# Patient Record
Sex: Female | Born: 1955
Health system: Southern US, Community
[De-identification: ages and names within clinical notes are randomized; demographics above are authoritative.]

## PROBLEM LIST (undated history)

## (undated) ENCOUNTER — Ambulatory Visit: Admission: EM | Payer: Medicare HMO

## (undated) DIAGNOSIS — T7840XA Allergy, unspecified, initial encounter: Secondary | ICD-10-CM

## (undated) DIAGNOSIS — M1990 Unspecified osteoarthritis, unspecified site: Secondary | ICD-10-CM

## (undated) DIAGNOSIS — K219 Gastro-esophageal reflux disease without esophagitis: Secondary | ICD-10-CM

## (undated) DIAGNOSIS — R059 Cough, unspecified: Secondary | ICD-10-CM

## (undated) DIAGNOSIS — Z8701 Personal history of pneumonia (recurrent): Secondary | ICD-10-CM

## (undated) DIAGNOSIS — J189 Pneumonia, unspecified organism: Secondary | ICD-10-CM

## (undated) DIAGNOSIS — R7989 Other specified abnormal findings of blood chemistry: Secondary | ICD-10-CM

## (undated) HISTORY — DX: Gastro-esophageal reflux disease without esophagitis: K21.9

## (undated) HISTORY — PX: TUBAL LIGATION: SHX77

## (undated) HISTORY — DX: Unspecified osteoarthritis, unspecified site: M19.90

## (undated) HISTORY — PX: ENDOMETRIAL ABLATION: SHX621

## (undated) HISTORY — PX: VARICOSE VEIN SURGERY: SHX832

## (undated) HISTORY — PX: OTHER SURGICAL HISTORY: SHX169

## (undated) HISTORY — DX: Allergy, unspecified, initial encounter: T78.40XA

---

## 1975-03-30 HISTORY — PX: TONSILLECTOMY: SUR1361

## 1984-03-29 HISTORY — PX: THYROID LOBECTOMY: SHX420

## 2010-06-02 LAB — COLOGUARD: COLOGUARD: NEGATIVE

## 2010-06-02 LAB — HM COLONOSCOPY

## 2010-06-02 NOTE — Progress Notes (Signed)
Subjective:   Anna Miles is a 55 y.o. 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  She denies a history of shortness of breath and wheezing.  Terrible URI/OM in December with spontaneous perforation L TM; ENT followup with resolution but still with intermittent congestion    Evaluation to date: none.   Treatment to date: decongestants, OTC products.  Patient does not smoke cigarettes.  Relevant PMH: No pertinent additional PMH and Asthma.    No current outpatient prescriptions on file.     No Known Allergies     Review of Systems  Pertinent items are noted in HPI.    Objective:     BP 104/62   Temp(Src) 98.3 ??F (36.8 ??C) (Oral)   Ht 5\' 6"  (1.676 m)   Wt 113 lb (51.256 kg)   BMI 18.24 kg/m2  General:  alert, cooperative, no distress   Eyes: conjunctivae/corneas clear.    Ears: abnormal TM AD - erythematous inferior portion, abnormal TM AS - retracted with no perforation noted   Sinuses: Normal paranasal sinuses without tenderness   Mouth:  Lips, mucosa, and tongue normal. Teeth and gums normal and abnormal findings: mild oropharyngeal erythema streaks    Neck: supple, symmetrical, trachea midline and no adenopathy.   Lungs: clear to auscultation bilaterally   Nares   normal         Assessment/Plan:   viral upper respiratory illness  Suggested symptomatic OTC remedies.  RTC prn.  Discussed diagnosis and treatment of viral URIs.  1. Acute nasopharyngitis    .She will return for a physical at her convenience

## 2010-06-02 NOTE — Progress Notes (Signed)
Pt presents with cold like symptoms that she has had for a week; non-productive cough that makes her "gag". Pt states that the cough is "rattlely". Pt noted a slight fever on Saturday.

## 2010-06-03 MED ORDER — TOBRAMYCIN 0.3 % EYE DROPS
0.3 % | OPHTHALMIC | Status: AC
Start: 2010-06-03 — End: 2010-06-08

## 2010-06-03 NOTE — Telephone Encounter (Signed)
Pt notified -

## 2010-06-03 NOTE — Telephone Encounter (Signed)
Pt said she was seen by you on [id number] and this morning she got up and her eyes  Was shut can you call in some thing for her the last time this happen she used tobramycin 0.3% to [facility] at [phone] any question call pt at [phone]

## 2010-06-03 NOTE — Telephone Encounter (Signed)
Probably is viral but the drops will still help with inflammation.  Will send a scrip to the pharmacy we have onfile

## 2010-12-11 NOTE — Progress Notes (Signed)
HISTORY OF PRESENT ILLNESS  Anna Miles is a 55 y.o. female.  She is here today for a physical.  HPI  History of recurrent UTI's since menopause.  Better about water, voiding practices and none now for a year or so.    Feels well and no complaints.  She is considering a skin check though she has not noted any particular lesions of the skin.  She did note a small bump right forehead near her hairline that was larger and shrunk down to current size.  It is non-tender.    Current Outpatient Prescriptions   Medication Sig Dispense Refill   ??? Cholecalciferol, Vitamin D3, (VITAMIN D3) 1,000 unit Cap Take  by mouth.           No Known Allergies  Past Medical History   Diagnosis Date   ??? Herniated disc 2009     lumbar; right radiculopathy; epidural helped     Past Surgical History   Procedure Date   ??? Hx tonsil and adenoidectomy    ??? Hx tubal ligation      Family History   Problem Relation Age of Onset   ??? Heart Disease Mother    ??? Stroke Mother    ??? Elevated Lipids Mother    ??? Hypertension Mother    ??? Alcohol abuse Father    ??? Other Father      complications of hernia surgery   ??? Elevated Lipids Brother    ??? Heart Disease Brother      valvular      History   Substance Use Topics   ??? Smoking status: Never Smoker    ??? Smokeless tobacco: Never Used   ??? Alcohol Use: Yes      0-4/week           Review of Systems   HENT:        Dental care UTD   Eyes: Negative for blurred vision.        Eye care UTD   Respiratory: Negative for cough, shortness of breath and wheezing.    Cardiovascular: Negative for chest pain, palpitations, claudication and leg swelling.   Gastrointestinal: Negative for heartburn, abdominal pain, diarrhea and constipation.   Genitourinary: Negative for frequency.        No incontinence   Musculoskeletal: Negative for joint pain.   Endo/Heme/Allergies: Environmental allergies: minor in the fall only.   Psychiatric/Behavioral: The patient does not have insomnia.        Physical Exam    Constitutional: She appears well-developed and well-nourished. No distress.   HENT:        TM's normal; nasal membranes with no boggy changes and oropharynx without lymphoid hyperplasia   Eyes: Conjunctivae and EOM are normal. Pupils are equal, round, and reactive to light.   Neck: Normal range of motion. Neck supple. No JVD present. No thyromegaly present.   Cardiovascular: Normal rate, regular rhythm, normal heart sounds and intact distal pulses.  Exam reveals no gallop.    No murmur heard.       No carotid or abdominal bruits   Pulmonary/Chest: Effort normal and breath sounds normal. She has no wheezes. She has no rales.   Abdominal: Soft. Bowel sounds are normal. She exhibits no mass. There is no tenderness.   Musculoskeletal: Normal range of motion. She exhibits no edema and no tenderness.   Lymphadenopathy:     She has no cervical adenopathy.   Neurological: She is alert.   Skin: No rash  noted.   Psychiatric: She has a normal mood and affect.     BP 110/56   Ht 5\' 6"  (1.676 m)   Wt 118 lb (53.524 kg)   BMI 19.05 kg/m2    ASSESSMENT and PLAN  1. Well woman exam  All screening, testing and immunizations discussed and up to date.  VITAMIN D, 25 HYDROXY, METABOLIC PANEL, COMPREHENSIVE   2. Need for Tdap vaccination  TETANUS, DIPHTHERIA TOXOIDS AND ACELLULAR PERTUSSIS VACCINE (TDAP), IN INDIVIDS. >=7, IM

## 2010-12-12 LAB — METABOLIC PANEL, COMPREHENSIVE
A-G Ratio: 2.1 (ref 1.1–2.5)
ALT (SGPT): 14 IU/L (ref 0–40)
AST (SGOT): 16 IU/L (ref 0–40)
Albumin: 4.7 g/dL (ref 3.5–5.5)
Alk. phosphatase: 69 IU/L (ref 25–150)
BUN/Creatinine ratio: 26 — ABNORMAL HIGH (ref 9–23)
BUN: 19 mg/dL (ref 6–24)
Bilirubin, total: 0.4 mg/dL (ref 0.0–1.2)
CO2: 30 mmol/L (ref 20–32)
Calcium: 9.7 mg/dL (ref 8.7–10.2)
Chloride: 101 mmol/L (ref 97–108)
Creatinine: 0.72 mg/dL (ref 0.57–1.00)
GFR est non-AA: 94 mL/min/{1.73_m2} (ref >59)
GLOBULIN, TOTAL: 2.2 g/dL (ref 1.5–4.5)
Glucose: 79 mg/dL (ref 65–99)
Potassium: 4.5 mmol/L (ref 3.5–5.2)
Protein, total: 6.9 g/dL (ref 6.0–8.5)
Sodium: 140 mmol/L (ref 134–144)
eGFR If African American: 108 mL/min/{1.73_m2} (ref >59)

## 2010-12-12 LAB — VITAMIN D, 25 HYDROXY: VITAMIN D, 25-HYDROXY: 28.3 ng/mL — ABNORMAL LOW (ref 30.0–100.0)

## 2010-12-12 NOTE — Progress Notes (Signed)
Quick Note:    Looks great!  ______

## 2010-12-13 NOTE — Progress Notes (Signed)
Quick Note:    Chemistries are normal but the vitamin D is a bit low. A 400 unit supplement daily will bring it into the normal range.  ______

## 2012-09-25 NOTE — Patient Instructions (Addendum)
PRESCRIPTION REFILL POLICY  Effective 06/28/2012    In an effort to ensure that the large volume of prescription refills are processed in the most efficient and expeditious manner, we are asking our patients to assist [place] by calling your pharmacy for all prescription refills. This will include Mail Order Pharmacies. The pharmacy will contact our office electronically to continue the refill process.     Please do not wait until the last minute to call your pharmacy. We require 72 hours (3 days) to fill prescriptions. We also encourage you to call your pharmacy before picking up your prescription to make sure it is ready.    With regard to controlled substance refill request (narcotics and many ADD/ADHD treatment prescriptions) and any other prescriptions that need to be picked up at our office, we ask your cooperation by providing [place] with at least 72 hours (3 days) notice prior to your need of the prescription. You will need to show a valid ID when picking up your prescription. Anyone delegated by you to pick up your prescriptions must be listed be listed on you HIPPA authorization form, and show a valid ID.    Narcotics will not be refilled on a weekend or on a holiday in which the office is closed.    We also encourage an alternative method of refill requests, which is through our medically secure web portal, MyChart. This is an efficient and effective way to communicate your requests directly with the office and provider. MyChart can be downloaded onto your smartphone as an App. If you are ready to be connected, please review the attached instructions or speak to any of our staff to get you set up right away!    Thank you so much for your cooperation. Should you have any questions, please contact our Practice Administrator, Lysle Dingwall at [phone]    Varicose Veins: After Your Visit  Your Care Instructions  Varicose veins are twisted, enlarged veins near the surface of the skin. They develop most often in the  legs and ankles.  Some people may be more likely than others to get varicose veins because of aging or hormone changes or because a parent has them. Being overweight or pregnant can make varicose veins worse. Jobs that require standing for long periods of time also can make them worse.  Follow-up care is a key part of your treatment and safety. Be sure to make and go to all appointments, and call your doctor if you are having problems. It???s also a good idea to know your test results and keep a list of the medicines you take.  How can you care for yourself at home?  ?? Wear compression stockings during the day to help relieve symptoms and slow how quickly the varicose veins get worse. They improve blood flow and are the main treatment for varicose veins. Talk to your doctor about which ones to get and where to get them.  ?? Prop up your legs at or above the level of your heart when possible. This helps keep the blood from pooling in your lower legs and improves blood flow to the rest of your body.  ?? Avoid sitting and standing for long periods. This puts added stress on your veins.  ?? Get regular exercise, and control your weight. Walk, bicycle, or swim to improve blood flow in your legs.  ?? If you bump your leg so hard that you know it is likely to bruise, prop up your leg and put ice  or a cold pack on the area for 10 to 20 minutes at a time. Try to do this every 1 to 2 hours for the next 3 days (when you are awake) or until the swelling goes down. Put a thin cloth between the ice and your skin.  ?? If you cut or scratch the skin over a vein, it may bleed a lot. Prop up your leg and apply firm pressure with a clean bandage over the site of the bleeding. Continue to apply pressure for a full 15 minutes. Do not check sooner to see if the bleeding has stopped. If the bleeding has not stopped after 15 minutes, apply pressure again for another 15 minutes. You can repeat this up to 3 times for a total of 45 minutes.  If you  have a blood clot in a varicose vein, you may have tenderness and swelling over the vein. The vein may feel firm. Be sure to call your doctor right away if you have these symptoms. If your doctor has told you how to care for the clot, follow his or her instructions. Care may include the following:  ?? Prop up your leg and apply heat with a warm, damp cloth or a heating pad set on low (put a towel or cloth between your leg and the heating pad to prevent burns).  ?? Take anti-inflammatory medicines to reduce pain and swelling. These include ibuprofen (Advil, Motrin) and naproxen (Aleve). Read and follow all instructions on the label.  When should you call for help?  Call 911 anytime you think you may need emergency care. For example, call if:  ?? You have sudden chest pain and shortness of breath, or you cough up blood.  Call your doctor now or seek immediate medical care if:  ?? You have signs of a blood clot, such as:  ?? Pain in your calf, back of the knee, thigh, or groin.  ?? Redness and swelling in your leg or groin.  ?? A varicose vein begins to bleed and you cannot stop it.  ?? You have a tender lump in your leg.  ?? You get an open sore.  Watch closely for changes in your health, and be sure to contact your doctor if:  ?? Your varicose vein symptoms do not improve with home treatment.   Where can you learn more?   Go to [email or web address]  Enter B637 in the search box to learn more about "Varicose Veins: After Your Visit."   ?? 2006-2014 [employer]. Care instructions adapted under license by [employer] (which disclaims liability or warranty for this information). This care instruction is for use with your licensed healthcare professional. If you have questions about a medical condition or this instruction, always ask your healthcare professional. [employer], Inc[employer] disclaims any warranty or liability for your use of this information.  Content Version: 10.0.270728; Last Revised:  November 02, 2011

## 2012-09-25 NOTE — Progress Notes (Signed)
Outpatient   Followup Note        Assessment :    Plan:      57  Year old with transient dependent edema with varicosities and puriplish ruptured veins last week now having resolved.   Posterior varicose veins, trace nonpitting edema right ankle greater than left  Vitamin D defficiency  Given her family hx of varicosities and AVR, likely CAD - counseling on salt intake , DASH diet  - varicose veins , not crossing her legs, knee high compression stockings  - given her family hx , 2nd hand smoke exposure, we will order ABI to be done with exercise  -she declined LE dopplers at this time due to no pain and no palpable cord.  - cholesterol per Dr. Adin Hector to be faxed to [place]                   ICD-9-CM    1. Edema 782.3 ANKLE BRACHIAL INDEX   2. Varicosities of leg 454.9 ANKLE BRACHIAL INDEX   3. Unspecified vitamin D deficiency 268.9    .    Chief Complaint:  Bilateral ankle swelling  For 1 week    S: 57 year old  With ankle bilateral edema  1 week ago with some    She is active   5 to 6 am every day  With intensive  Exercise cycles 3 times a week, body pump, and  Yoga.     Review of Systems:    Symptom Y/N Comments  Symptom Y/N Comments   Fever/Chills n   Chest Pain n    Poor Appetite n   Edema n    Cough n   Abdominal Pain n    Sputum n   Joint Pain n    SOB/DOE n   Pruritis/Rash n    Nausea/vomit n   Tolerating PT/OT     Diarrhea n   Tolerating Diet y    Constipation n   Other         Objective:     VITALS: BP 130/74   Pulse 80   Temp(Src) 99.1 ??F (37.3 ??C) (Oral)   Resp 14   Ht 5' 5.25" (1.657 m)   Wt 109 lb (49.442 kg)   BMI 18.01 kg/m2   SpO2 96%      PHYSICAL EXAM:  General: Alert, cooperative, no acute distress    EENT:  EOMI. Anicteric sclerae. PERRL; no goiter; TM's cloudy;  Resp:  CTA Bilaterally. No Wheezing/Rhonchi/Rales. No access. muscle use  CV:  Regular  rhythm,  No murmur (), No Rubs, No Gallops. No edema  GI:  Soft, Non  distended, Non tender.  +Bowel sounds, no HSM  Neurologic:  CN 2-12 gi, Alert and oriented X 3.    Psych:   Good insight. Not anxious nor agitated.  UE:             No upper extremity edema;   LE:             Right greater than left ankle nonpitting ankle edema; with varicositiesk no palpable cords.   Skin:  Good Turgor, no rashes or ulcers      Current Outpatient Prescriptions   Medication Sig Dispense Refill   ??? Cholecalciferol, Vitamin D3, (VITAMIN D3) 1,000 unit Cap Take  by mouth.              Greater than 50% of this visit was spent in counseling the patient about varicosities  and .

## 2012-09-25 NOTE — Progress Notes (Signed)
Reviewed record in preparation for visit and have obtained necessary documentation    Health Maintenance Due   Topic   ??? Pap Aka Cervical Cytology    ??? Breast Cancer Scrn Mammogram    ??? Fobt Q 1 Year Age 58-75          Chief Complaint   Patient presents with   ??? Ankle swelling     pt states that she has had ankle and leg swelling since 09/17/12 she states that she started propping up her legs on 09/24/12 and swelling started going down.  she stated that while legs and ankle was swollen purple spots was present throughout leg and ankle        Wt Readings from Last 3 Encounters:   09/25/12 109 lb (49.442 kg)   12/11/10 118 lb (53.524 kg)   06/02/10 113 lb (51.256 kg)     Temp Readings from Last 3 Encounters:   09/25/12 99.1 ??F (37.3 ??C) Oral   06/02/10 98.3 ??F (36.8 ??C) Oral     BP Readings from Last 3 Encounters:   09/25/12 130/74   12/11/10 110/56   06/02/10 104/62     Pulse Readings from Last 3 Encounters:   09/25/12 80         Learning Assessment:  :     Learning Assessment 09/25/2012   PRIMARY LEARNER Patient   HIGHEST LEVEL OF EDUCATION - PRIMARY LEARNER  4 YEARS OF COLLEGE   BARRIERS PRIMARY LEARNER NONE   CO-LEARNER CAREGIVER No   PRIMARY LANGUAGE ENGLISH   INTERPRETER NEED No   LEARNER PREFERENCE PRIMARY OTHER (COMMENT)   LEARNING SPECIAL TOPICS no   ANSWERED BY patient   RELATIONSHIP SELF   ASSESSMENT COMMENT none       Depression Screening:  :     PHQ 2 / 9, over the last two weeks 09/25/2012   Little interest or pleasure in doing things Not at all   Feeling down, depressed or hopeless Not at all   Total Score PHQ 2 0       Fall Risk Assessment:  :         Abuse Screening:  :     Abuse Screening Questionnaire 09/25/2012   Do you ever feel afraid of your partner? N   Are you in a relationship with someone who physically or mentally threatens you? N   Is it safe for you to go home? Y       Coordination of Care Questionnaire:  :     1) Have you been to an emergency room, urgent care clinic since your last visit?  no   Hospitalized since your last visit? no             2) Have you seen or consulted any other health care providers outside of [facility] since your last visit? no  (Include any pap smears or colon screenings in this section.)    3) Do you have an Advance Directive on file? no  Are you interested in receiving information about Advance Directives? no    Patient is accompanied by self I have received verbal consent from Paulla Fore to discuss any/all medical information while they are present in the room.

## 2012-09-28 NOTE — Progress Notes (Signed)
Quick Note:    My charts email sent on 09/28/2012..  ______

## 2012-09-28 NOTE — Procedures (Signed)
[facility]  *** FINAL REPORT ***    Name: Anna Miles, Anna Miles  MRN: [id number]  DOB: 29 Oct 1955  HIS Order #: [id number]  TRAKnet Visit #: [id number]  Date: 27 Sep 2012    TYPE OF TEST: Peripheral Arterial Testing    REASON FOR TEST  PAD suspected    Right Leg  Doppler:  PVR:  Ankle/Brachial: 1.19    Left Leg  Doppler:  PVR:  Ankle/Brachial: 1.27    INTERPRETATION/FINDINGS  PROCEDURE:  Evaluation of lower extremity arteries with systolic blood   pressure measurement at the ankle and brachial level and calculation  of the ankle/brachial pressure index (ABI).  The exam may also include   pulse volume recording (PVR) plethysmography at the ankle level.    FINDINGS:  ABI is 1.19 on the right and 1.27 on the left.  PVR  waveforms are normal at the right ankle and normal at the left ankle.    IMPRESSION:  There is no evidence of hemodynamically significant lower   extremity arterial obstruction.    ADDITIONAL COMMENTS    I have personally reviewed the data relevant to the interpretation of  this  study.    TECHNOLOGIST: James Kaczor, RVT  Signed: 09/27/2012 03:25 PM    PHYSICIAN: Aliyyah Riese Stoneburner, Jr., MD  Signed: 09/28/2012 11:22 AM

## 2012-09-28 NOTE — Procedures (Signed)
[facility]  *** FINAL REPORT ***    Name: Anna Miles, Anna Miles  MRN: [id number]  DOB: 07/06/1955  HIS Order #: [id number]  TRAKnet Visit #: [id number]  Date: 27 Sep 2012    TYPE OF TEST: Peripheral Arterial Testing    REASON FOR TEST  PAD suspected    Right Leg  Doppler:  PVR:  Ankle/Brachial: 1.19    Left Leg  Doppler:  PVR:  Ankle/Brachial: 1.27    INTERPRETATION/FINDINGS  PROCEDURE:  Evaluation of lower extremity arteries with systolic blood   pressure measurement at the ankle and brachial level and calculation  of the ankle/brachial pressure index (ABI).  The exam may also include   pulse volume recording (PVR) plethysmography at the ankle level.    FINDINGS:  ABI is 1.19 on the right and 1.27 on the left.  PVR  waveforms are normal at the right ankle and normal at the left ankle.    IMPRESSION:  There is no evidence of hemodynamically significant lower   extremity arterial obstruction.    ADDITIONAL COMMENTS    I have personally reviewed the data relevant to the interpretation of  this  study.    TECHNOLOGIST: Bonnye Fava, RVT  Signed: 09/27/2012 03:25 PM    PHYSICIAN: Roylene Reason., MD  Signed: 09/28/2012 11:22 AM

## 2014-03-07 LAB — HM MAMMOGRAPHY

## 2015-05-09 ENCOUNTER — Encounter: Payer: Self-pay | Admitting: Internal Medicine

## 2015-05-09 ENCOUNTER — Ambulatory Visit (INDEPENDENT_AMBULATORY_CARE_PROVIDER_SITE_OTHER): Payer: BLUE CROSS/BLUE SHIELD | Admitting: Internal Medicine

## 2015-05-09 VITALS — BP 108/68 | HR 74 | Temp 98.6°F | Ht 65.33 in | Wt 152.0 lb

## 2015-05-09 DIAGNOSIS — K219 Gastro-esophageal reflux disease without esophagitis: Secondary | ICD-10-CM

## 2015-05-09 DIAGNOSIS — J309 Allergic rhinitis, unspecified: Secondary | ICD-10-CM | POA: Diagnosis not present

## 2015-05-09 DIAGNOSIS — K7689 Other specified diseases of liver: Secondary | ICD-10-CM

## 2015-05-09 NOTE — Patient Instructions (Signed)
Upper Respiratory Infection, Adult Most upper respiratory infections (URIs) are a viral infection of the air passages leading to the lungs. A URI affects the nose, throat, and upper air passages. The most common type of URI is nasopharyngitis and is typically referred to as "the common cold." URIs run their course and usually go away on their own. Most of the time, a URI does not require medical attention, but sometimes a bacterial infection in the upper airways can follow a viral infection. This is called a secondary infection. Sinus and middle ear infections are common types of secondary upper respiratory infections. Bacterial pneumonia can also complicate a URI. A URI can worsen asthma and chronic obstructive pulmonary disease (COPD). Sometimes, these complications can require emergency medical care and may be life threatening.  CAUSES Almost all URIs are caused by viruses. A virus is a type of germ and can spread from one person to another.  RISKS FACTORS You may be at risk for a URI if:   You smoke.   You have chronic heart or lung disease.  You have a weakened defense (immune) system.   You are very young or very old.   You have nasal allergies or asthma.  You work in crowded or poorly ventilated areas.  You work in health care facilities or schools. SIGNS AND SYMPTOMS  Symptoms typically develop 2-3 days after you come in contact with a cold virus. Most viral URIs last 7-10 days. However, viral URIs from the influenza virus (flu virus) can last 14-18 days and are typically more severe. Symptoms may include:   Runny or stuffy (congested) nose.   Sneezing.   Cough.   Sore throat.   Headache.   Fatigue.   Fever.   Loss of appetite.   Pain in your forehead, behind your eyes, and over your cheekbones (sinus pain).  Muscle aches.  DIAGNOSIS  Your health care provider may diagnose a URI by:  Physical exam.  Tests to check that your symptoms are not due to  another condition such as:  Strep throat.  Sinusitis.  Pneumonia.  Asthma. TREATMENT  A URI goes away on its own with time. It cannot be cured with medicines, but medicines may be prescribed or recommended to relieve symptoms. Medicines may help:  Reduce your fever.  Reduce your cough.  Relieve nasal congestion. HOME CARE INSTRUCTIONS   Take medicines only as directed by your health care provider.   Gargle warm saltwater or take cough drops to comfort your throat as directed by your health care provider.  Use a warm mist humidifier or inhale steam from a shower to increase air moisture. This may make it easier to breathe.  Drink enough fluid to keep your urine clear or pale yellow.   Eat soups and other clear broths and maintain good nutrition.   Rest as needed.   Return to work when your temperature has returned to normal or as your health care provider advises. You may need to stay home longer to avoid infecting others. You can also use a face mask and careful hand washing to prevent spread of the virus.  Increase the usage of your inhaler if you have asthma.   Do not use any tobacco products, including cigarettes, chewing tobacco, or electronic cigarettes. If you need help quitting, ask your health care provider. PREVENTION  The best way to protect yourself from getting a cold is to practice good hygiene.   Avoid oral or hand contact with people with cold   symptoms.   Wash your hands often if contact occurs.  There is no clear evidence that vitamin C, vitamin E, echinacea, or exercise reduces the chance of developing a cold. However, it is always recommended to get plenty of rest, exercise, and practice good nutrition.  SEEK MEDICAL CARE IF:   You are getting worse rather than better.   Your symptoms are not controlled by medicine.   You have chills.  You have worsening shortness of breath.  You have brown or red mucus.  You have yellow or brown nasal  discharge.  You have pain in your face, especially when you bend forward.  You have a fever.  You have swollen neck glands.  You have pain while swallowing.  You have white areas in the back of your throat. SEEK IMMEDIATE MEDICAL CARE IF:   You have severe or persistent:  Headache.  Ear pain.  Sinus pain.  Chest pain.  You have chronic lung disease and any of the following:  Wheezing.  Prolonged cough.  Coughing up blood.  A change in your usual mucus.  You have a stiff neck.  You have changes in your:  Vision.  Hearing.  Thinking.  Mood. MAKE SURE YOU:   Understand these instructions.  Will watch your condition.  Will get help right away if you are not doing well or get worse.   This information is not intended to replace advice given to you by your health care provider. Make sure you discuss any questions you have with your health care provider.   Document Released: 09/08/2000 Document Revised: 07/30/2014 Document Reviewed: 06/20/2013 Elsevier Interactive Patient Education 2016 [employer]  

## 2015-05-09 NOTE — Progress Notes (Signed)
Pre visit review using our clinic review tool, if applicable. No additional management support is needed unless otherwise documented below in the visit note. 

## 2015-05-09 NOTE — Progress Notes (Signed)
HPI  Pt presents to the clinic today to establish care and for management of the conditions listed below. She moved here from [place] and is transferring care from Dr. Loletta Specter.  GERD: Rare. Triggered by spicy foods. She does try to avoid these. She does not take anything OTC.   She also c/o nasal congestion and cough. She reports this started 8 days ago. She is blowing clear mucous out of her nose. She is coughing clear mucous up. She has run low grade fevers but denies fever, chills or body aches.. She has taken Ibuprofen with some relief. She has not had sick contacts. She has no history of seasonal allergies.  She also reports she has a liver cyst. She has been getting yearly ultrasounds and report she is due for this now.  Flu: 2015 Tetanus: 2011 Zostovax: never Pap Smear: 04/2014 Mammogram: 05/2014 Colon Screening: 2012, every 5 years Vision Screening: yearly Dentist: yearly  Past Medical History  Diagnosis Date  . GERD (gastroesophageal reflux disease)     Current Outpatient Prescriptions  Medication Sig Dispense Refill  . OVER THE COUNTER MEDICATION Take 1 capsule by mouth 3 (three) times daily. Ultra Vir-X---dietary supplement     No current facility-administered medications for this visit.    Allergies  Allergen Reactions  . Penicillins Rash    Family History  Problem Relation Age of Onset  . Colon cancer Mother   . Heart failure Mother   . Lung cancer Paternal Grandfather     Social History   Social History  . Marital Status: Married    Spouse Name: N/A  . Number of Children: N/A  . Years of Education: N/A   Occupational History  . Not on file.   Social History Main Topics  . Smoking status: Never Smoker   . Smokeless tobacco: Never Used  . Alcohol Use: 0.0 oz/week    0 Standard drinks or equivalent per week     Comment: occasional wine  . Drug Use: Not on file  . Sexual Activity: Not on file   Other Topics Concern  . Not on file    Social History Narrative  . No narrative on file    ROS:  Constitutional: Denies fever, malaise, fatigue, headache or abrupt weight changes.  HEENT: Pt reports nasal congestion. Denies eye pain, eye redness, ear pain, ringing in the ears, wax buildup, runny nose, bloody nose, or sore throat. Respiratory: Pt reports cough. Denies difficulty breathing, shortness of breath, or sputum production.   Cardiovascular: Denies chest pain, chest tightness, palpitations or swelling in the hands or feet.  Gastrointestinal: Denies abdominal pain, bloating, constipation, diarrhea or blood in the stool.  Neurological: Denies dizziness, difficulty with memory, difficulty with speech or problems with balance and coordination.  Psych: Denies anxiety, depression, SI/HI.  No other specific complaints in a complete review of systems (except as listed in HPI above).  PE:  BP 108/68 mmHg  Pulse 74  Temp(Src) 98.6 F (37 C) (Oral)  Ht 5' 5.33" (1.659 m)  Wt 152 lb (68.947 kg)  BMI 25.05 kg/m2  SpO2 99% Wt Readings from Last 3 Encounters:  05/09/15 152 lb (68.947 kg)    General: Appears her stated age, well developed, well nourished in NAD. HEENT: Head: normal shape and size, no sinus tenderness noted; Eyes: sclera white, no icterus, conjunctiva pink; Ears: Tm's gray and intact, normal light reflex; Throat/Mouth: Teeth present, mucosa pink and moist, + PND, no lesions or ulcerations noted.  Neck:  No adenopathy noted.  Cardiovascular: Normal rate and rhythm. S1,S2 noted.  No murmur, rubs or gallops noted. Pulmonary/Chest: Normal effort and positive vesicular breath sounds. No respiratory distress. No wheezes, rales or ronchi noted.  Abdomen: Soft and nontender. Normal bowel sounds. Neurological: Alert and oriented.   Psychiatric: Mood and affect normal. Behavior is normal. Judgment and thought content normal.     Assessment and Plan:  Allergic Rhinitis:  Advised her to start Zyrtec and Flonase  OTC  Liver Cyst:  RUQ ultrasound ordered  GERD:  Avoid triggers Ok to take TUMS or Rolaids as needed  Make an appt for your annual exam

## 2015-05-14 ENCOUNTER — Ambulatory Visit
Admission: RE | Admit: 2015-05-14 | Discharge: 2015-05-14 | Disposition: A | Discharge status: Home / Self Care | Payer: BLUE CROSS/BLUE SHIELD | Source: Ambulatory Visit | Attending: Internal Medicine | Admitting: Internal Medicine

## 2015-05-14 ENCOUNTER — Other Ambulatory Visit: Payer: Self-pay | Admitting: Internal Medicine

## 2015-05-14 DIAGNOSIS — N281 Cyst of kidney, acquired: Secondary | ICD-10-CM

## 2015-05-14 DIAGNOSIS — K7689 Other specified diseases of liver: Secondary | ICD-10-CM | POA: Diagnosis present

## 2015-05-14 DIAGNOSIS — K8020 Calculus of gallbladder without cholecystitis without obstruction: Secondary | ICD-10-CM | POA: Diagnosis not present

## 2015-05-14 DIAGNOSIS — Q6100 Congenital renal cyst, unspecified: Secondary | ICD-10-CM | POA: Diagnosis present

## 2015-05-14 NOTE — Addendum Note (Signed)
Addended by: Lurlean Nanny on: 05/14/2015 08:17 AM   Modules accepted: Orders

## 2015-08-18 ENCOUNTER — Encounter: Payer: BLUE CROSS/BLUE SHIELD | Admitting: Internal Medicine

## 2015-11-12 ENCOUNTER — Ambulatory Visit (INDEPENDENT_AMBULATORY_CARE_PROVIDER_SITE_OTHER): Payer: BLUE CROSS/BLUE SHIELD | Admitting: Internal Medicine

## 2015-11-12 ENCOUNTER — Encounter: Payer: Self-pay | Admitting: Internal Medicine

## 2015-11-12 ENCOUNTER — Ambulatory Visit (INDEPENDENT_AMBULATORY_CARE_PROVIDER_SITE_OTHER)
Admission: RE | Admit: 2015-11-12 | Discharge: 2015-11-12 | Disposition: A | Discharge status: Home / Self Care | Payer: BLUE CROSS/BLUE SHIELD | Source: Ambulatory Visit | Attending: Internal Medicine | Admitting: Internal Medicine

## 2015-11-12 VITALS — BP 116/80 | HR 66 | Temp 98.4°F | Ht 65.0 in | Wt 155.0 lb

## 2015-11-12 DIAGNOSIS — Z1211 Encounter for screening for malignant neoplasm of colon: Secondary | ICD-10-CM

## 2015-11-12 DIAGNOSIS — Z9009 Acquired absence of other part of head and neck: Secondary | ICD-10-CM

## 2015-11-12 DIAGNOSIS — E890 Postprocedural hypothyroidism: Secondary | ICD-10-CM | POA: Diagnosis not present

## 2015-11-12 DIAGNOSIS — M25552 Pain in left hip: Secondary | ICD-10-CM

## 2015-11-12 DIAGNOSIS — Z1239 Encounter for other screening for malignant neoplasm of breast: Secondary | ICD-10-CM

## 2015-11-12 DIAGNOSIS — Z0000 Encounter for general adult medical examination without abnormal findings: Secondary | ICD-10-CM | POA: Diagnosis not present

## 2015-11-12 DIAGNOSIS — Z780 Asymptomatic menopausal state: Secondary | ICD-10-CM

## 2015-11-12 NOTE — Patient Instructions (Signed)
Health Maintenance, Female Adopting a healthy lifestyle and getting preventive care can go a long way to promote health and wellness. Talk with your health care provider about what schedule of regular examinations is right for you. This is a good chance for you to check in with your provider about disease prevention and staying healthy. In between checkups, there are plenty of things you can do on your own. Experts have done a lot of research about which lifestyle changes and preventive measures are most likely to keep you healthy. Ask your health care provider for more information. WEIGHT AND DIET  Eat a healthy diet  Be sure to include plenty of vegetables, fruits, low-fat dairy products, and lean protein.  Do not eat a lot of foods high in solid fats, added sugars, or salt.  Get regular exercise. This is one of the most important things you can do for your health.  Most adults should exercise for at least 150 minutes each week. The exercise should increase your heart rate and make you sweat (moderate-intensity exercise).  Most adults should also do strengthening exercises at least twice a week. This is in addition to the moderate-intensity exercise.  Maintain a healthy weight  Body mass index (BMI) is a measurement that can be used to identify possible weight problems. It estimates body fat based on height and weight. Your health care provider can help determine your BMI and help you achieve or maintain a healthy weight.  For females 20 years of age and older:   A BMI below 18.5 is considered underweight.  A BMI of 18.5 to 24.9 is normal.  A BMI of 25 to 29.9 is considered overweight.  A BMI of 30 and above is considered obese.  Watch levels of cholesterol and blood lipids  You should start having your blood tested for lipids and cholesterol at 61 years of age, then have this test every 5 years.  You may need to have your cholesterol levels checked more often if:  Your lipid  or cholesterol levels are high.  You are older than 61 years of age.  You are at high risk for heart disease.  CANCER SCREENING   Lung Cancer  Lung cancer screening is recommended for adults 55-80 years old who are at high risk for lung cancer because of a history of smoking.  A yearly low-dose CT scan of the lungs is recommended for people who:  Currently smoke.  Have quit within the past 15 years.  Have at least a 30-pack-year history of smoking. A pack year is smoking an average of one pack of cigarettes a day for 1 year.  Yearly screening should continue until it has been 15 years since you quit.  Yearly screening should stop if you develop a health problem that would prevent you from having lung cancer treatment.  Breast Cancer  Practice breast self-awareness. This means understanding how your breasts normally appear and feel.  It also means doing regular breast self-exams. Let your health care provider know about any changes, no matter how small.  If you are in your 20s or 30s, you should have a clinical breast exam (CBE) by a health care provider every 1-3 years as part of a regular health exam.  If you are 40 or older, have a CBE every year. Also consider having a breast X-ray (mammogram) every year.  If you have a family history of breast cancer, talk to your health care provider about genetic screening.  If you   are at high risk for breast cancer, talk to your health care provider about having an MRI and a mammogram every year.  Breast cancer gene (BRCA) assessment is recommended for women who have family members with BRCA-related cancers. BRCA-related cancers include:  Breast.  Ovarian.  Tubal.  Peritoneal cancers.  Results of the assessment will determine the need for genetic counseling and BRCA1 and BRCA2 testing. Cervical Cancer Your health care provider may recommend that you be screened regularly for cancer of the pelvic organs (ovaries, uterus, and  vagina). This screening involves a pelvic examination, including checking for microscopic changes to the surface of your cervix (Pap test). You may be encouraged to have this screening done every 3 years, beginning at age 21.  For women ages 30-65, health care providers may recommend pelvic exams and Pap testing every 3 years, or they may recommend the Pap and pelvic exam, combined with testing for human papilloma virus (HPV), every 5 years. Some types of HPV increase your risk of cervical cancer. Testing for HPV may also be done on women of any age with unclear Pap test results.  Other health care providers may not recommend any screening for nonpregnant women who are considered low risk for pelvic cancer and who do not have symptoms. Ask your health care provider if a screening pelvic exam is right for you.  If you have had past treatment for cervical cancer or a condition that could lead to cancer, you need Pap tests and screening for cancer for at least 20 years after your treatment. If Pap tests have been discontinued, your risk factors (such as having a new sexual partner) need to be reassessed to determine if screening should resume. Some women have medical problems that increase the chance of getting cervical cancer. In these cases, your health care provider may recommend more frequent screening and Pap tests. Colorectal Cancer  This type of cancer can be detected and often prevented.  Routine colorectal cancer screening usually begins at 61 years of age and continues through 61 years of age.  Your health care provider may recommend screening at an earlier age if you have risk factors for colon cancer.  Your health care provider may also recommend using home test kits to check for hidden blood in the stool.  A small camera at the end of a tube can be used to examine your colon directly (sigmoidoscopy or colonoscopy). This is done to check for the earliest forms of colorectal  cancer.  Routine screening usually begins at age 50.  Direct examination of the colon should be repeated every 5-10 years through 61 years of age. However, you may need to be screened more often if early forms of precancerous polyps or small growths are found. Skin Cancer  Check your skin from head to toe regularly.  Tell your health care provider about any new moles or changes in moles, especially if there is a change in a mole's shape or color.  Also tell your health care provider if you have a mole that is larger than the size of a pencil eraser.  Always use sunscreen. Apply sunscreen liberally and repeatedly throughout the day.  Protect yourself by wearing long sleeves, pants, a wide-brimmed hat, and sunglasses whenever you are outside. HEART DISEASE, DIABETES, AND HIGH BLOOD PRESSURE   High blood pressure causes heart disease and increases the risk of stroke. High blood pressure is more likely to develop in:  People who have blood pressure in the high end   of the normal range (130-139/85-89 mm Hg).  People who are overweight or obese.  People who are African American.  If you are 38-23 years of age, have your blood pressure checked every 3-5 years. If you are 61 years of age or older, have your blood pressure checked every year. You should have your blood pressure measured twice--once when you are at a hospital or clinic, and once when you are not at a hospital or clinic. Record the average of the two measurements. To check your blood pressure when you are not at a hospital or clinic, you can use:  An automated blood pressure machine at a pharmacy.  A home blood pressure monitor.  If you are between 45 years and 39 years old, ask your health care provider if you should take aspirin to prevent strokes.  Have regular diabetes screenings. This involves taking a blood sample to check your fasting blood sugar level.  If you are at a normal weight and have a low risk for diabetes,  have this test once every three years after 61 years of age.  If you are overweight and have a high risk for diabetes, consider being tested at a younger age or more often. PREVENTING INFECTION  Hepatitis B  If you have a higher risk for hepatitis B, you should be screened for this virus. You are considered at high risk for hepatitis B if:  You were born in a country where hepatitis B is common. Ask your health care provider which countries are considered high risk.  Your parents were born in a high-risk country, and you have not been immunized against hepatitis B (hepatitis B vaccine).  You have HIV or AIDS.  You use needles to inject street drugs.  You live with someone who has hepatitis B.  You have had sex with someone who has hepatitis B.  You get hemodialysis treatment.  You take certain medicines for conditions, including cancer, organ transplantation, and autoimmune conditions. Hepatitis C  Blood testing is recommended for:  Everyone born from [birth year] through 1966.  Anyone with known risk factors for hepatitis C. Sexually transmitted infections (STIs)  You should be screened for sexually transmitted infections (STIs) including gonorrhea and chlamydia if:  You are sexually active and are younger than 61 years of age.  You are older than 61 years of age and your health care provider tells you that you are at risk for this type of infection.  Your sexual activity has changed since you were last screened and you are at an increased risk for chlamydia or gonorrhea. Ask your health care provider if you are at risk.  If you do not have HIV, but are at risk, it may be recommended that you take a prescription medicine daily to prevent HIV infection. This is called pre-exposure prophylaxis (PrEP). You are considered at risk if:  You are sexually active and do not regularly use condoms or know the HIV status of your partner(s).  You take drugs by injection.  You are sexually  active with a partner who has HIV. Talk with your health care provider about whether you are at high risk of being infected with HIV. If you choose to begin PrEP, you should first be tested for HIV. You should then be tested every 3 months for as long as you are taking PrEP.  PREGNANCY   If you are premenopausal and you may become pregnant, ask your health care provider about preconception counseling.  If you may  become pregnant, take 400 to 800 micrograms (mcg) of folic acid every day.  If you want to prevent pregnancy, talk to your health care provider about birth control (contraception). OSTEOPOROSIS AND MENOPAUSE   Osteoporosis is a disease in which the bones lose minerals and strength with aging. This can result in serious bone fractures. Your risk for osteoporosis can be identified using a bone density scan.  If you are 61 years of age or older, or if you are at risk for osteoporosis and fractures, ask your health care provider if you should be screened.  Ask your health care provider whether you should take a calcium or vitamin D supplement to lower your risk for osteoporosis.  Menopause may have certain physical symptoms and risks.  Hormone replacement therapy may reduce some of these symptoms and risks. Talk to your health care provider about whether hormone replacement therapy is right for you.  HOME CARE INSTRUCTIONS   Schedule regular health, dental, and eye exams.  Stay current with your immunizations.   Do not use any tobacco products including cigarettes, chewing tobacco, or electronic cigarettes.  If you are pregnant, do not drink alcohol.  If you are breastfeeding, limit how much and how often you drink alcohol.  Limit alcohol intake to no more than 1 drink per day for nonpregnant women. One drink equals 12 ounces of beer, 5 ounces of wine, or 1 ounces of hard liquor.  Do not use street drugs.  Do not share needles.  Ask your health care provider for help if  you need support or information about quitting drugs.  Tell your health care provider if you often feel depressed.  Tell your health care provider if you have ever been abused or do not feel safe at home.   This information is not intended to replace advice given to you by your health care provider. Make sure you discuss any questions you have with your health care provider.   Document Released: 09/28/2010 Document Revised: 04/05/2014 Document Reviewed: 02/14/2013 Elsevier Interactive Patient Education [employer].

## 2015-11-12 NOTE — Progress Notes (Signed)
HPI  Pt presents to the clinic today for her annual exam.  Flu: [id number] Tetanus: 2011 Zostovax: never Pap Smear: 04/2014 Mammogram: 05/2014 Bone Density: > 5 years ago Colon Screening: 2012, every 5 years Vision Screening: yearly Dentist: yearly  She has been having left hip pain. This started 3 months ago. She denies any injury to the area at that time. She describes the pain as aching. The pain radiates around to her left groin. The pain is  worse with laying down at night. She did see a chiropractor last week with some relief. She denies back pain. She has taken Motrin but is not sure if it helped.  Diet: She does eat meat. She consumes some fruits and veggies. She tries to avoid fried foods. She drinks mostly water. Exercise: She is walking 4 days per week.   Past Medical History:  Diagnosis Date  . GERD (gastroesophageal reflux disease)     No current outpatient prescriptions on file.   No current facility-administered medications for this visit.     Allergies  Allergen Reactions  . Penicillins Rash    Family History  Problem Relation Age of Onset  . Colon cancer Mother   . Heart failure Mother   . Lung cancer Paternal Grandfather   . Cancer Sister     Glioblastoma    Social History   Social History  . Marital status: Married    Spouse name: N/A  . Number of children: N/A  . Years of education: N/A   Occupational History  . Not on file.   Social History Main Topics  . Smoking status: Never Smoker  . Smokeless tobacco: Never Used  . Alcohol use 0.0 oz/week     Comment: occasional wine  . Drug use: No  . Sexual activity: Yes   Other Topics Concern  . Not on file   Social History Narrative  . No narrative on file     Constitutional: Denies fever, malaise, fatigue, headache or abrupt weight changes.  HEENT: Denies eye pain, eye redness, ear pain, ringing in the ears, wax buildup, runny nose, nasal congestion, bloody nose, or sore  throat. Respiratory: Denies difficulty breathing, shortness of breath, cough or sputum production.   Cardiovascular: Denies chest pain, chest tightness, palpitations or swelling in the hands or feet.  Gastrointestinal: Denies abdominal pain, bloating, constipation, diarrhea or blood in the stool.  GU: Denies urgency, frequency, pain with urination, burning sensation, blood in urine, odor or discharge. Musculoskeletal: Pt reports let hip pain. Denies decrease in range of motion, difficulty with gait, muscle pain or joint swelling.  Skin: Denies redness, rashes, lesions or ulcercations.  Neurological: Denies dizziness, difficulty with memory, difficulty with speech or problems with balance and coordination.  Psych: Denies anxiety, depression, SI/HI.  No other specific complaints in a complete review of systems (except as listed in HPI above).   PE:  BP 116/80   Pulse 66   Temp 98.4 F (36.9 C) (Oral)   Ht 5\' 5"  (1.651 m)   Wt 155 lb (70.3 kg)   SpO2 98%   BMI 25.79 kg/m  Wt Readings from Last 3 Encounters:  11/12/15 155 lb (70.3 kg)  05/09/15 152 lb (68.9 kg)    General: Appears her stated age, well developed, well nourished in NAD. Skin: Warm, dry and intact. No rashes, lesions or ulcerations noted. HEENT: Head: normal shape and size; Eyes: sclera white, no icterus, conjunctiva pink, PERRLA and EOMs intact; Ears: Tm's gray and intact, normal  light reflex; Throat/Mouth: Teeth present, mucosa pink and moist, no exudate, lesions or ulcerations noted.  Neck:  Neck supple, trachea midline. No masses, lumps or thyromegaly present.  Cardiovascular: Normal rate and rhythm. S1,S2 noted.  No murmur, rubs or gallops noted. No JVD or BLE edema. No carotid bruits noted. Pulmonary/Chest: Normal effort and positive vesicular breath sounds. No respiratory distress. No wheezes, rales or ronchi noted.  Abdomen: Soft and nontender. Normal bowel sounds. No distention or masses noted. Liver, spleen and  kidneys non palpable. Musculoskeletal: Normal flexion and extension of the left hip. Normal abduction and adduction of the left hip. Pain with internal rotation. Normal external rotation. Pain with palpation over the trochanteric bursa. No bony tenderness noted over the spine. Her gait is affected by the hip pain. Neurological: Alert and oriented. Cranial nerves II-XII grossly intact. Coordination normal.  Psychiatric: Mood and affect normal. Behavior is normal. Judgment and thought content normal.    Assessment and Plan:  Preventative Health Maintenance:  Encouraged her to get a flu shot in the fall Tetanus UTD She will call her insurance about shingles vaccines Pap smear UTD Mammogram and bone density ordered, she will call [facility] to schedule Referral placed to GI for screening colonoscopy Encouraged her to see an eye doctor and dentist annually Encouraged her to consume a balanced diet and exercise regimen Will check CBC, CMET, Lipid, Vit D today She declines HIV screening  Left hip pain:  ? Bursitis, vs pulled muscle She insist on xray of left hip today Try ibuprofen and stretching Will follow up after xray  RTC in 1 year, sooner if needed .mecfred

## 2015-11-13 ENCOUNTER — Encounter: Payer: BLUE CROSS/BLUE SHIELD | Admitting: Internal Medicine

## 2015-11-13 LAB — COMPREHENSIVE METABOLIC PANEL
ALBUMIN: 4.8 g/dL (ref 3.5–5.2)
ALT: 11 U/L (ref 0–35)
AST: 22 U/L (ref 0–37)
Alkaline Phosphatase: 59 U/L (ref 39–117)
BUN: 10 mg/dL (ref 6–23)
CALCIUM: 9.8 mg/dL (ref 8.4–10.5)
CHLORIDE: 102 meq/L (ref 96–112)
CO2: 28 meq/L (ref 19–32)
Creatinine, Ser: 0.7 mg/dL (ref 0.40–1.20)
GFR: 90.41 mL/min (ref >60.00)
Glucose, Bld: 77 mg/dL (ref 70–99)
POTASSIUM: 3.9 meq/L (ref 3.5–5.1)
Sodium: 140 mEq/L (ref 135–145)
Total Bilirubin: 0.6 mg/dL (ref 0.2–1.2)
Total Protein: 7.3 g/dL (ref 6.0–8.3)

## 2015-11-13 LAB — CBC
HEMATOCRIT: 39.7 % (ref 36.0–46.0)
HEMOGLOBIN: 13.3 g/dL (ref 12.0–15.0)
MCHC: 33.5 g/dL (ref 30.0–36.0)
MCV: 95.4 fl (ref 78.0–100.0)
PLATELETS: 220 10*3/uL (ref 150.0–400.0)
RBC: 4.16 Mil/uL (ref 3.87–5.11)
RDW: 13.1 % (ref 11.5–15.5)
WBC: 5.2 10*3/uL (ref 4.0–10.5)

## 2015-11-13 LAB — T3: T3, Total: 95 ng/dL (ref 76–181)

## 2015-11-13 LAB — VITAMIN D 25 HYDROXY (VIT D DEFICIENCY, FRACTURES): VITD: 28.36 ng/mL — ABNORMAL LOW (ref 30.00–100.00)

## 2015-11-13 LAB — TSH: TSH: 3.16 u[IU]/mL (ref 0.35–4.50)

## 2015-11-13 LAB — T4, FREE: Free T4: 0.83 ng/dL (ref 0.60–1.60)

## 2015-11-18 NOTE — Addendum Note (Signed)
Addended by: Lurlean Nanny on: 11/18/2015 05:51 PM   Modules accepted: Orders

## 2015-11-19 ENCOUNTER — Telehealth: Payer: Self-pay

## 2015-11-19 NOTE — Telephone Encounter (Signed)
I spoke to pt in reference to her recent lab and xray results.Shelby KitchenMarland KitchenLipid panel was not ordered and pt would like this to be drawn---lab future ordered---pt would also like a copy of xray burned to disc.... Please call pt when disc is ready for pick up and she would like to have her labs drawn when she picks up disc

## 2015-11-20 ENCOUNTER — Encounter: Payer: Self-pay | Admitting: Internal Medicine

## 2015-11-24 ENCOUNTER — Encounter: Payer: Self-pay | Admitting: Internal Medicine

## 2015-11-25 ENCOUNTER — Other Ambulatory Visit (INDEPENDENT_AMBULATORY_CARE_PROVIDER_SITE_OTHER): Payer: BLUE CROSS/BLUE SHIELD

## 2015-11-25 ENCOUNTER — Telehealth: Payer: Self-pay | Admitting: Internal Medicine

## 2015-11-25 DIAGNOSIS — Z0000 Encounter for general adult medical examination without abnormal findings: Secondary | ICD-10-CM

## 2015-11-25 LAB — LIPID PANEL
CHOL/HDL RATIO: 3
Cholesterol: 225 mg/dL — ABNORMAL HIGH (ref 0–200)
HDL: 70.9 mg/dL (ref >39.00)
LDL CALC: 144 mg/dL — AB (ref 0–99)
NONHDL: 154.48
Triglycerides: 53 mg/dL (ref 0.0–149.0)
VLDL: 10.6 mg/dL (ref 0.0–40.0)

## 2015-11-25 NOTE — Telephone Encounter (Signed)
Records have been placed on Dr.Pyrtle's desk for review. °

## 2015-11-26 NOTE — Telephone Encounter (Signed)
Ok for high risk screening colonoscopy if last was in 2012 or before given FH of colon cancer in her mother

## 2015-12-04 ENCOUNTER — Encounter: Payer: Self-pay | Admitting: Internal Medicine

## 2015-12-05 NOTE — Telephone Encounter (Signed)
Records Received,Reviewed & Approved to schedule Direct. Appointment scheduled for 01-28-2016.

## 2015-12-05 NOTE — Telephone Encounter (Signed)
Records Received,Reviewed and Approved to schedule direct by Dr Hilarie Fredrickson. Appointment has been scheduled for Jan 28, 2016.

## 2015-12-17 ENCOUNTER — Ambulatory Visit
Admission: RE | Admit: 2015-12-17 | Discharge: 2015-12-17 | Disposition: A | Discharge status: Home / Self Care | Payer: BLUE CROSS/BLUE SHIELD | Source: Ambulatory Visit | Attending: Internal Medicine | Admitting: Internal Medicine

## 2015-12-17 DIAGNOSIS — Z1231 Encounter for screening mammogram for malignant neoplasm of breast: Secondary | ICD-10-CM | POA: Diagnosis present

## 2015-12-17 DIAGNOSIS — Z780 Asymptomatic menopausal state: Secondary | ICD-10-CM | POA: Diagnosis not present

## 2015-12-17 DIAGNOSIS — Z1239 Encounter for other screening for malignant neoplasm of breast: Secondary | ICD-10-CM

## 2015-12-17 DIAGNOSIS — M85852 Other specified disorders of bone density and structure, left thigh: Secondary | ICD-10-CM | POA: Insufficient documentation

## 2016-01-22 ENCOUNTER — Ambulatory Visit: Payer: BLUE CROSS/BLUE SHIELD | Admitting: *Deleted

## 2016-01-22 VITALS — Ht 65.0 in | Wt 158.2 lb

## 2016-01-22 DIAGNOSIS — Z800 Family history of malignant neoplasm of digestive organs: Secondary | ICD-10-CM

## 2016-01-22 MED ORDER — SUPREP BOWEL PREP KIT 17.5-3.13-1.6 GM/177ML PO SOLN: 1.0000 | Freq: Once | ORAL | 0 refills | Status: AC

## 2016-01-22 NOTE — Progress Notes (Signed)
Patient denies any allergies to egg or soy products. Patient denies complications with anesthesia/sedation.  Patient denies oxygen use at home and denies diet medications. Emmi instructions for colonoscopy explained but patient denied.     

## 2016-01-26 ENCOUNTER — Encounter: Payer: Self-pay | Admitting: Internal Medicine

## 2016-01-28 ENCOUNTER — Encounter: Payer: BLUE CROSS/BLUE SHIELD | Admitting: Internal Medicine

## 2016-02-03 ENCOUNTER — Encounter: Payer: BLUE CROSS/BLUE SHIELD | Admitting: Internal Medicine

## 2016-02-16 ENCOUNTER — Ambulatory Visit: Admit: 2016-02-16 | Discharge: 2016-02-16 | Payer: PRIVATE HEALTH INSURANCE | Attending: Family | Primary: Family

## 2016-02-16 DIAGNOSIS — R509 Fever, unspecified: Secondary | ICD-10-CM

## 2016-02-16 LAB — AMB POC RAPID INFLUENZA TEST: QuickVue Influenza test: NEGATIVE

## 2016-02-16 LAB — AMB POC URINALYSIS DIP STICK AUTO W/O MICRO
Ketones (UA POC): NEGATIVE
Nitrites (UA POC): NEGATIVE
Protein (UA POC): POSITIVE
Specific gravity (UA POC): 1.02 (ref 1.001–1.035)
Urobilinogen (UA POC): 8 (ref 0.2–1)
pH (UA POC): 5.5 (ref 4.6–8.0)

## 2016-02-16 LAB — AMB POC RAPID STREP A: Group A Strep Ag: NEGATIVE

## 2016-02-16 NOTE — Progress Notes (Signed)
HISTORY OF PRESENT ILLNESS  Anna Miles is a 60 y.o. female. Patient reports fever, chills, body aches, and fatigue that started suddenly 4 days ago. Fever has been running as high as 102. Patient has been taking Motrin every 4 hours. Patient reports nausea and deceased appetite, but no vomiting or diarrhea. She denies sore throat or urinary symptoms. She states she has had a UTI in the past with no typical urinary symptoms. Denies abdominal, neck, or back pain.  Visit Vitals   ??? BP 98/54 (BP 1 Location: Left arm, BP Patient Position: Sitting)   ??? Pulse 98   ??? Temp 100.1 ??F (37.8 ??C) (Oral)   ??? Resp 16   ??? Ht 5' 6.53" (1.69 m)   ??? Wt 115 lb 3.2 oz (52.3 kg)   ??? SpO2 96%   ??? BMI 18.3 kg/m2       Generalized Body Aches   The history is provided by the patient. This is a new problem. Episode onset: 4 days ago-suddent onset. The problem occurs constantly. Treatments tried: motrin every 4 hours. The treatment provided no relief.       Review of Systems   Constitutional: Positive for chills, fever and malaise/fatigue.   HENT: Negative for sore throat.    Respiratory: Positive for cough.    Genitourinary: Negative for dysuria and frequency.   Musculoskeletal: Positive for myalgias.   Neurological: Positive for weakness.       Physical Exam   Constitutional: She is oriented to person, place, and time. She appears well-developed and well-nourished.   HENT:   Head: Normocephalic.   Right Ear: Hearing, tympanic membrane, external ear and ear canal normal.   Left Ear: Hearing, tympanic membrane, external ear and ear canal normal.   Nose: Nose normal.   Mouth/Throat: Uvula is midline, oropharynx is clear and moist and mucous membranes are normal.   Neck: Normal range of motion. Neck supple.   Cardiovascular: Normal rate, regular rhythm and normal heart sounds.    Pulmonary/Chest: Effort normal and breath sounds normal.   Lymphadenopathy:     She has cervical adenopathy.    Neurological: She is alert and oriented to person, place, and time.   Skin: Skin is warm and dry. There is pallor.   Psychiatric: She has a normal mood and affect.     Results for orders placed or performed in visit on 02/16/16   AMB POC URINALYSIS DIP STICK AUTO W/O MICRO   Result Value Ref Range    Color (UA POC) Amber     Clarity (UA POC) Cloudy     Glucose (UA POC) 1+ Negative    Bilirubin (UA POC) 3+ Negative    Ketones (UA POC) Negative Negative    Specific gravity (UA POC) 1.020 1.001 - 1.035    Blood (UA POC) 3+ Negative    pH (UA POC) 5.5 4.6 - 8.0    Protein (UA POC) Positive Negative    Urobilinogen (UA POC) 8 mg/dL 0.2 - 1    Nitrites (UA POC) Negative Negative    Leukocyte esterase (UA POC) 1+ Negative   AMB POC RAPID STREP A   Result Value Ref Range    VALID INTERNAL CONTROL POC Yes     Group A Strep Ag Negative Negative   AMB POC RAPID INFLUENZA TEST   Result Value Ref Range    VALID INTERNAL CONTROL POC Yes     QuickVue Influenza test Negative Negative     ASSESSMENT and  PLAN    ICD-10-CM ICD-9-CM    1. Fever, unspecified fever cause R50.9 780.60 AMB POC URINALYSIS DIP STICK AUTO W/O MICRO      MONONUCLEOSIS SCREEN      CBC WITH AUTOMATED DIFF      METABOLIC PANEL, COMPREHENSIVE      AMB POC RAPID STREP A      UPPER RESPIRATORY CULTURE      AMB POC RAPID INFLUENZA TEST   2. Fatigue, unspecified type R53.83 780.79    3. Chills R68.83 780.64    4. Body aches R52 780.96    5. Abnormal urinalysis R82.90 791.9 CULTURE, URINE     Orders Placed This Encounter   ??? CULTURE, URINE   ??? UPPER RESPIRATORY CULTURE   ??? MONONUCLEOSIS SCREEN   ??? CBC WITH AUTOMATED DIFF   ??? METABOLIC PANEL, COMPREHENSIVE   ??? AMB POC URINALYSIS DIP STICK AUTO W/O MICRO   ??? AMB POC RAPID STREP A   ??? AMB POC RAPID INFLUENZA TEST   ??? Omega-3 Fatty Acids 60-90-500 mg cpDR   ??? cycloSPORINE (RESTASIS) 0.05 % ophthalmic emulsion   ??? calcium-cholecalciferol, d3, (CALCIUM 600 + D) 600-125 mg-unit tab    consulted with Dr. Debroah Loopu regarding plan of care  Will wait for cultures and lab results to decide further treatment plan  For now, hydrate and continue ibuprofen to control fever

## 2016-02-16 NOTE — Progress Notes (Signed)
Chief Complaint   Patient presents with   ??? Generalized Body Aches     pt c/o body aches, headaches, chills/sweats & non-productive cough that started Thursday morning.

## 2016-02-17 ENCOUNTER — Telehealth

## 2016-02-17 NOTE — Telephone Encounter (Signed)
Patient wanted to know lab results, but they are not back yet.  Patient states she is having right-sided back/rib pain and some shortness of breath with activity. Patient advised she should go to ED if pain or SOB worsens. Patient states she had diarrhea last night, but has still urinated today.  CXR ordered.

## 2016-02-17 NOTE — Telephone Encounter (Signed)
Patient would like to give you a update on how she is doing, and she would also like to know if lab results from yesterday are back, please call.

## 2016-02-18 ENCOUNTER — Inpatient Hospital Stay: Admit: 2016-02-18 | Payer: BLUE CROSS/BLUE SHIELD | Primary: Family

## 2016-02-18 ENCOUNTER — Telehealth

## 2016-02-18 DIAGNOSIS — J984 Other disorders of lung: Secondary | ICD-10-CM

## 2016-02-18 LAB — CULTURE, URINE

## 2016-02-18 LAB — CBC WITH AUTOMATED DIFF
ABS. BASOPHILS: 0 10*3/uL (ref 0.0–0.2)
ABS. EOSINOPHILS: 0 10*3/uL (ref 0.0–0.4)
ABS. IMM. GRANS.: 0.4 10*3/uL — ABNORMAL HIGH (ref 0.0–0.1)
ABS. MONOCYTES: 0.1 10*3/uL (ref 0.1–0.9)
ABS. NEUTROPHILS: 17.7 10*3/uL — ABNORMAL HIGH (ref 1.4–7.0)
Abs Lymphocytes: 0.8 10*3/uL (ref 0.7–3.1)
BASOPHILS: 0 %
EOSINOPHILS: 0 %
HCT: 36 % (ref 34.0–46.6)
HGB: 12.1 g/dL (ref 11.1–15.9)
IMMATURE GRANULOCYTES: 2 %
Lymphocytes: 4 %
MCH: 32.4 pg (ref 26.6–33.0)
MCHC: 33.6 g/dL (ref 31.5–35.7)
MCV: 97 fL (ref 79–97)
MONOCYTES: 1 %
NEUTROPHILS: 93 %
PLATELET: 224 10*3/uL (ref 150–379)
RBC: 3.73 x10E6/uL — ABNORMAL LOW (ref 3.77–5.28)
RDW: 13.7 % (ref 12.3–15.4)
WBC: 18.9 10*3/uL — ABNORMAL HIGH (ref 3.4–10.8)

## 2016-02-18 LAB — METABOLIC PANEL, COMPREHENSIVE
A-G Ratio: 1.1 — ABNORMAL LOW (ref 1.2–2.2)
ALT (SGPT): 17 IU/L (ref 0–32)
AST (SGOT): 22 IU/L (ref 0–40)
Albumin: 3.2 g/dL — ABNORMAL LOW (ref 3.6–4.8)
Alk. phosphatase: 115 IU/L (ref 39–117)
BUN/Creatinine ratio: 25 (ref 12–28)
BUN: 24 mg/dL (ref 8–27)
Bilirubin, total: 1 mg/dL (ref 0.0–1.2)
CO2: 23 mmol/L (ref 18–29)
Calcium: 8.7 mg/dL (ref 8.7–10.3)
Chloride: 92 mmol/L — ABNORMAL LOW (ref 96–106)
Creatinine: 0.96 mg/dL (ref 0.57–1.00)
GFR est AA: 74 mL/min/{1.73_m2} (ref >59)
GFR est non-AA: 64 mL/min/{1.73_m2} (ref >59)
GLOBULIN, TOTAL: 2.8 g/dL (ref 1.5–4.5)
Glucose: 113 mg/dL — ABNORMAL HIGH (ref 65–99)
Potassium: 3.5 mmol/L (ref 3.5–5.2)
Protein, total: 6 g/dL (ref 6.0–8.5)
Sodium: 136 mmol/L (ref 134–144)

## 2016-02-18 LAB — MONONUCLEOSIS SCREEN: Mononucleosis Test, Ql.: NEGATIVE

## 2016-02-18 MED ORDER — LEVOFLOXACIN 500 MG TAB
500 mg | ORAL_TABLET | Freq: Every day | ORAL | 0 refills | Status: AC
Start: 2016-02-18 — End: 2016-02-28

## 2016-02-18 NOTE — Telephone Encounter (Signed)
Patient had a few questions about tx of her loose stools, and wondered if lab results were back.    Plan: Clear liq diet   Reviewed labs (high WBC, protein in urine, normal renal and liver fx)

## 2016-02-18 NOTE — Telephone Encounter (Signed)
Pt wants her lab result  From Monday when you call her donna

## 2016-02-18 NOTE — Telephone Encounter (Signed)
Notifed patient that she has Pneumonia in RML & LLL (per radiologist by phone today - CXR done yesterday).    Plan: Levaquin for 10 days   Hydration   Probiotic   F/U on Dec 4th with Chelsea PrimusSusanna Payne, NP to establish for care   Will need f/u CXR in about 3 weeks

## 2016-02-18 NOTE — Telephone Encounter (Signed)
Padgette with [facility] called to report that xray patient had done this morning revealed that she has right middle and right lower lobe pneumonia. A follow up xray is requested after treatment.

## 2016-02-19 LAB — UPPER RESPIRATORY CULTURE

## 2016-02-25 NOTE — Telephone Encounter (Signed)
Called to check on patient. Patient states she is having a lot of fatigue and cough, but feeling better since starting the antibiotic. Patient requests appointment for her mother for physical for respite stay at [facility]. Appointment scheduled with August Luzonna East on 03/01/16

## 2016-03-01 ENCOUNTER — Encounter

## 2016-03-01 ENCOUNTER — Encounter: Attending: Family | Primary: Family

## 2016-03-01 ENCOUNTER — Inpatient Hospital Stay: Admit: 2016-03-01 | Payer: BLUE CROSS/BLUE SHIELD | Primary: Family

## 2016-03-01 ENCOUNTER — Ambulatory Visit: Admit: 2016-03-01 | Discharge: 2016-03-01 | Payer: PRIVATE HEALTH INSURANCE | Attending: Family | Primary: Family

## 2016-03-01 DIAGNOSIS — J189 Pneumonia, unspecified organism: Secondary | ICD-10-CM

## 2016-03-01 DIAGNOSIS — R410 Disorientation, unspecified: Secondary | ICD-10-CM

## 2016-03-01 MED ORDER — BENZONATATE 200 MG CAP
200 mg | ORAL_CAPSULE | Freq: Three times a day (TID) | ORAL | 0 refills | Status: AC | PRN
Start: 2016-03-01 — End: 2016-03-08

## 2016-03-01 NOTE — Progress Notes (Signed)
Patient's identity verified with two patient identifiers (name and date of birth).    1. Have you been to the ER, urgent care clinic since your last visit?  Hospitalized since your last visit?No  2. Have you seen or consulted any other health care providers outside of the [facility] since your last visit?  Include any pap smears or colon screening. No    Chief Complaint   Patient presents with   ??? Establish Care     Previous Dr. Astrid DraftsLucchesi & Gatmaiman-logie patient.   ??? Pneumonia     Finished Levaquin Friday.  Requesting discussion.  Questions about repeat x-ray.  Feels "good", but "so exhausted" once she wakes up.  "Wears me out".  Sleeping varies.  Still with dry cough.     Not fasting.    Health Maintenance Due   Topic Date Due   ??? Hepatitis C Screening  October 01, 1955   ??? PAP AKA CERVICAL CYTOLOGY   Due, aware, scheduled for 04/2016. 10/29/1975   ??? BREAST CANCER SCRN MAMMOGRAM   Due, aware, scheduled for 04/2016. 10/28/2004   ??? FOBT Q 1 YEAR AGE 52-75  10/28/2004   ??? ZOSTER VACCINE AGE 86>   Has not had. 08/29/2014   ??? Influenza Age 239 to Adult   Done per patient, 12/2015. 10/28/2015     Reviewed record in preparation for visit and have obtained necessary documentation.    Wt Readings from Last 3 Encounters:   03/01/16 108 lb (49 kg)   02/16/16 115 lb 3.2 oz (52.3 kg)   09/25/12 109 lb (49.4 kg)     Temp Readings from Last 3 Encounters:   03/01/16 99.6 ??F (37.6 ??C) (Oral)   02/16/16 100.1 ??F (37.8 ??C) (Oral)   09/25/12 99.1 ??F (37.3 ??C) (Oral)     BP Readings from Last 3 Encounters:   03/01/16 112/66   02/16/16 98/54   09/25/12 130/74     Pulse Readings from Last 3 Encounters:   03/01/16 98   02/16/16 98   09/25/12 80       Learning Assessment:  :     Learning Assessment 03/01/2016 09/25/2012   PRIMARY LEARNER Patient Patient   HIGHEST LEVEL OF EDUCATION - PRIMARY LEARNER  4 YEARS OF COLLEGE 4 YEARS OF COLLEGE   BARRIERS PRIMARY LEARNER NONE NONE   CO-LEARNER CAREGIVER No No    PRIMARY LANGUAGE ENGLISH ENGLISH   INTERPRETER NEED - No   LEARNER PREFERENCE PRIMARY READING OTHER (COMMENT)   LEARNING SPECIAL TOPICS - no   ANSWERED BY patient patient   RELATIONSHIP SELF SELF   ASSESSMENT COMMENT - none       Depression Screening:  :     PHQ over the last two weeks 03/01/2016   Little interest or pleasure in doing things Not at all   Feeling down, depressed or hopeless Not at all   Total Score PHQ 2 0       Fall Risk Assessment:  :     No flowsheet data found.    Abuse Screening:  :     Abuse Screening Questionnaire 03/01/2016 09/25/2012   Do you ever feel afraid of your partner? N N   Are you in a relationship with someone who physically or mentally threatens you? N N   Is it safe for you to go home? Malvin JohnsY Y

## 2016-03-01 NOTE — Progress Notes (Signed)
Subjective:      Anna Miles is a 60 y.o. female who presents today for follow up of PNA    Seen by NP Thrift 02/16/2016    02/12/2016, woke up in the middle of the night, exhausted, diarrhea, cough  Came into office 02/16/2016 for evaluation, had R sided pain and difficulty breathing    02/17/2016 CXR showing RML and RLL PNA  Complete levofloxacin x 10 days    Feels much better "200%", but still reporting fatigue and dyspnea with exertion  Still with nonproductive cough  Has not taken any cough syrup    02/25/2016: had been working on applications, looking at paper, vision blurred, sentences didn't make sense, slightly confused, felt better one hour later    Normally very healthy, [facility] 5:30am cycle/body pump/yoga/cycle strength    Patient Active Problem List    Diagnosis Date Noted   ??? Unspecified vitamin D deficiency 09/25/2012   ??? Herniated disc      Current Outpatient Prescriptions   Medication Sig Dispense Refill   ??? benzonatate (TESSALON) 200 mg capsule Take 1 Cap by mouth three (3) times daily as needed for Cough for up to 7 days. 21 Cap 0   ??? Omega-3 Fatty Acids 60-90-500 mg cpDR Take 3 Caps by mouth daily.     ??? cycloSPORINE (RESTASIS) 0.05 % ophthalmic emulsion Administer 1 Drop to both eyes two (2) times a day.     ??? calcium-cholecalciferol, d3, (CALCIUM 600 + D) 600-125 mg-unit tab Take 1 Tab by mouth daily.     ??? Cholecalciferol, Vitamin D3, (VITAMIN D3) 1,000 unit Cap Take 1,000 Units by mouth daily.          Review of Systems    Pertinent items are noted in HPI.     Objective:     Visit Vitals   ??? BP 112/66 (BP 1 Location: Left arm, BP Patient Position: Sitting)   ??? Pulse 90   ??? Temp 99.3 ??F (37.4 ??C)   ??? Resp 16   ??? Ht 5' 6.53" (1.69 m)   ??? Wt 108 lb (49 kg)   ??? SpO2 96%   ??? BMI 17.16 kg/m2     General appearance: alert, cooperative, no distress, appears stated age  Head: Normocephalic, without obvious abnormality, atraumatic   Neck: supple, symmetrical, trachea midline, no adenopathy, thyroid: not enlarged, symmetric, no tenderness/mass/nodules, no carotid bruit and no JVD  Lungs: clear to auscultation bilaterally, + dry persistent cough  Heart: slightly tachycardic, regular rhythm, no murmur  Skin: Skin color, texture, turgor normal.   Lymph nodes: Cervical, nodes normal.  Neurologic: Grossly normal    Assessment/Plan:     1. H/O: pneumonia  - fatigue and cough  - XR CHEST PA LAT; Future  - CBC WITH AUTOMATED DIFF    2. Fatigue, unspecified type  - METABOLIC PANEL, COMPREHENSIVE  - CBC WITH AUTOMATED DIFF  - TSH RFX ON ABNORMAL TO FREE T4  - VITAMIN D, 25 HYDROXY  - HEMOGLOBIN A1C WITH EAG    3. Cough  Tessalon pearls 3x/day    4. Transient confusion  - DUPLEX CAROTID BILATERAL; Future  - LIPID PANEL      Advised her to call back or return to office if symptoms worsen/change/persist.  Discussed expected course/resolution/complications of diagnosis in detail with patient.    Medication risks/benefits/costs/interactions/alternatives discussed with patient.  She was given an after visit summary which includes diagnoses, current medications, & vitals.  She expressed understanding  with the diagnosis and plan.    Chelsea PrimusSusanna Orey Moure, FNP

## 2016-03-01 NOTE — Patient Instructions (Signed)
Schedule your carotid duplex    Have [facility]- must be fasting x 8 hours    Go get a repeat CXR at [facility]

## 2016-03-01 NOTE — Progress Notes (Signed)
Spoke with patient.  Pt to let me know if stops improving, or if she worsens  Continue to rest and work only as tolerated    Discussed need for f/u CXR, recommend in 6 weeks.  Order placed, patient aware and will schedule when she has her carotid dopplers scheduled.    Traci SermonSusanna S Rayjon Wery, NP

## 2016-03-04 ENCOUNTER — Inpatient Hospital Stay: Admit: 2016-03-04 | Payer: BLUE CROSS/BLUE SHIELD | Attending: Family | Primary: Family

## 2016-03-04 DIAGNOSIS — I6521 Occlusion and stenosis of right carotid artery: Secondary | ICD-10-CM

## 2016-03-05 ENCOUNTER — Encounter

## 2016-03-05 LAB — CBC WITH AUTOMATED DIFF
ABS. BASOPHILS: 0.1 10*3/uL (ref 0.0–0.2)
ABS. EOSINOPHILS: 0.1 10*3/uL (ref 0.0–0.4)
ABS. IMM. GRANS.: 0 10*3/uL (ref 0.0–0.1)
ABS. MONOCYTES: 0.5 10*3/uL (ref 0.1–0.9)
ABS. NEUTROPHILS: 2.1 10*3/uL (ref 1.4–7.0)
Abs Lymphocytes: 1.9 10*3/uL (ref 0.7–3.1)
BASOPHILS: 2 %
EOSINOPHILS: 1 %
HCT: 33.4 % — ABNORMAL LOW (ref 34.0–46.6)
HGB: 11.2 g/dL (ref 11.1–15.9)
IMMATURE GRANULOCYTES: 0 %
Lymphocytes: 40 %
MCH: 32 pg (ref 26.6–33.0)
MCHC: 33.5 g/dL (ref 31.5–35.7)
MCV: 95 fL (ref 79–97)
MONOCYTES: 11 %
NEUTROPHILS: 46 %
PLATELET: 787 10*3/uL — ABNORMAL HIGH (ref 150–379)
RBC: 3.5 x10E6/uL — ABNORMAL LOW (ref 3.77–5.28)
RDW: 14.1 % (ref 12.3–15.4)
WBC: 4.7 10*3/uL (ref 3.4–10.8)

## 2016-03-05 LAB — METABOLIC PANEL, COMPREHENSIVE
A-G Ratio: 1.1 — ABNORMAL LOW (ref 1.2–2.2)
ALT (SGPT): 12 IU/L (ref 0–32)
AST (SGOT): 15 IU/L (ref 0–40)
Albumin: 3.7 g/dL (ref 3.6–4.8)
Alk. phosphatase: 79 IU/L (ref 39–117)
BUN/Creatinine ratio: 20 (ref 12–28)
BUN: 14 mg/dL (ref 8–27)
Bilirubin, total: 0.4 mg/dL (ref 0.0–1.2)
CO2: 27 mmol/L (ref 18–29)
Calcium: 9.1 mg/dL (ref 8.7–10.3)
Chloride: 98 mmol/L (ref 96–106)
Creatinine: 0.7 mg/dL (ref 0.57–1.00)
GFR est AA: 108 mL/min/{1.73_m2} (ref >59)
GFR est non-AA: 94 mL/min/{1.73_m2} (ref >59)
GLOBULIN, TOTAL: 3.5 g/dL (ref 1.5–4.5)
Glucose: 84 mg/dL (ref 65–99)
Potassium: 4.4 mmol/L (ref 3.5–5.2)
Protein, total: 7.2 g/dL (ref 6.0–8.5)
Sodium: 137 mmol/L (ref 134–144)

## 2016-03-05 LAB — LIPID PANEL
Cholesterol, total: 200 mg/dL — ABNORMAL HIGH (ref 100–199)
HDL Cholesterol: 62 mg/dL (ref >39)
LDL, calculated: 118 mg/dL — ABNORMAL HIGH (ref 0–99)
Triglyceride: 100 mg/dL (ref 0–149)
VLDL, calculated: 20 mg/dL (ref 5–40)

## 2016-03-05 LAB — HEMOGLOBIN A1C WITH EAG
Estimated average glucose: 111 mg/dL
Hemoglobin A1c: 5.5 % (ref 4.8–5.6)

## 2016-03-05 LAB — TSH RFX ON ABNORMAL TO FREE T4: TSH: 1.65 u[IU]/mL (ref 0.450–4.500)

## 2016-03-05 LAB — CVD REPORT

## 2016-03-05 LAB — VITAMIN D, 25 HYDROXY: VITAMIN D, 25-HYDROXY: 38.8 ng/mL (ref 30.0–100.0)

## 2016-03-05 NOTE — Procedures (Signed)
[facility]  *** FINAL REPORT ***    Name: [name], Anna  MRN: [id number]    Outpatient  DOB: 29 Oct 1955  HIS Order #: [id number]  TRAKnet Visit #: [id number]  Date: 04 Mar 2016    TYPE OF TEST: Cerebrovascular Duplex    REASON FOR TEST  Hx  of posibble TIA, Transient confusion    Right Carotid:-             Proximal               Mid                 Distal  cm/s  Systolic  Diastolic  Systolic  Diastolic  Systolic  Diastolic  CCA:    100.0      21.0                            92.0      27.0  Bulb:  ECA:     77.0       8.0  ICA:     53.0      13.0                            81.0      32.0  ICA/CCA:  0.6       0.5    ICA Stenosis: <50%    Right Vertebral:-  Finding: Antegrade  Sys:       68.0  Dia:       19.0    Right Subclavian:    Left Carotid:-            Proximal                Mid                 Distal  cm/s  Systolic  Diastolic  Systolic  Diastolic  Systolic  Diastolic  CCA:    109.0      28.0                           107.0      33.0  Bulb:  ECA:     74.0       5.0  ICA:     97.0      38.0                            76.0      26.0  ICA/CCA:  0.9       1.2    ICA Stenosis: Normal    Left Vertebral:-  Finding: Antegrade  Sys:       86.0  Dia:       27.0    Left Subclavian:    INTERPRETATION/FINDINGS  PROCEDURE:  Color duplex ultrasound imaging of extracranial  cerebrovascular arteries.    FINDINGS:       Right:  Internal carotid velocity is within normal limits.  There   is narrowing of the internal carotid flow channel on color Doppler  imaging and non-calcific plaque on B-mode imaging, consistent with  less than 50 percent stenosis.  The common and external carotid  arteries are patent and without evidence of hemodynamically  significant stenosis.       Left:  Internal carotid velocity is  within normal limits, there  is no observed narrowing of the flow channel on color Doppler imaging,   and no plaque is visualized on B-mode imaging.  The common and   external carotid arteries are patent and without evidence of  hemodynamically significant stenosis.    IMPRESSION:  Consistent with less than 50% stenosis of the right  internal carotid and no stenosis of the left internal carotid.  Vertebrals are patent with antegrade flow.    ADDITIONAL COMMENTS    I have personally reviewed the data relevant to the interpretation of  this  study.    TECHNOLOGIST: Virgina OrganLakeysha Harris, RVS, RCS  Signed: 03/04/2016 04:19 PM    PHYSICIAN: Roylene ReasonFrank Stoneburner, Jr., MD  Signed: 03/05/2016 07:31 AM

## 2016-03-05 NOTE — Progress Notes (Signed)
No additional comment

## 2016-03-05 NOTE — Progress Notes (Signed)
Spoke with patient regarding results.  Suggested adding a daily aspirin to her regimen.  Lipids were checked yesterday, results still pending    Anna SermonSusanna S Elanna Bert, NP

## 2016-03-05 NOTE — Procedures (Signed)
[facility]  *** FINAL REPORT ***    Name: Anna Miles, Anna  MRN: [id number]    Outpatient  DOB: 29 Oct 1955  HIS Order #: [id number]  TRAKnet Visit #: [id number]  Date: 04 Mar 2016    TYPE OF TEST: Cerebrovascular Duplex    REASON FOR TEST  Hx  of posibble TIA, Transient confusion    Right Carotid:-             Proximal               Mid                 Distal  cm/s  Systolic  Diastolic  Systolic  Diastolic  Systolic  Diastolic  CCA:    100.0      21.0                            92.0      27.0  Bulb:  ECA:     77.0       8.0  ICA:     53.0      13.0                            81.0      32.0  ICA/CCA:  0.6       0.5    ICA Stenosis: <50%    Right Vertebral:-  Finding: Antegrade  Sys:       68.0  Dia:       19.0    Right Subclavian:    Left Carotid:-            Proximal                Mid                 Distal  cm/s  Systolic  Diastolic  Systolic  Diastolic  Systolic  Diastolic  CCA:    109.0      28.0                           107.0      33.0  Bulb:  ECA:     74.0       5.0  ICA:     97.0      38.0                            76.0      26.0  ICA/CCA:  0.9       1.2    ICA Stenosis: Normal    Left Vertebral:-  Finding: Antegrade  Sys:       86.0  Dia:       27.0    Left Subclavian:    INTERPRETATION/FINDINGS  PROCEDURE:  Color duplex ultrasound imaging of extracranial  cerebrovascular arteries.    FINDINGS:       Right:  Internal carotid velocity is within normal limits.  There   is narrowing of the internal carotid flow channel on color Doppler  imaging and non-calcific plaque on B-mode imaging, consistent with  less than 50 percent stenosis.  The common and external carotid  arteries are patent and without evidence of hemodynamically  significant stenosis.       Left:  Internal carotid velocity is  within normal limits, there  is no observed narrowing of the flow channel on color Doppler imaging,   and no plaque is visualized on B-mode imaging.  The common and  external carotid arteries are patent and  without evidence of  hemodynamically significant stenosis.    IMPRESSION:  Consistent with less than 50% stenosis of the right  internal carotid and no stenosis of the left internal carotid.  Vertebrals are patent with antegrade flow.    ADDITIONAL COMMENTS    I have personally reviewed the data relevant to the interpretation of  this  study.    TECHNOLOGIST: Virgina OrganLakeysha Harris, RVS, RCS  Signed: 03/04/2016 04:19 PM    PHYSICIAN: Roylene ReasonFrank Stoneburner, Jr., MD  Signed: 03/05/2016 07:31 AM

## 2016-03-10 NOTE — Telephone Encounter (Signed)
From: Paulla ForeJacqueline D Sun  To: Golden Circleonna S East, NP  Sent: 03/10/2016 4:07 AM EST  Subject:  Visit Follow-Up Question    Started  back to work this week - working about 6 hours a day. Feeling much better, however mobilty is still slow. Finished cough medicine on Monday. Today, I seem to be coughing a lot more. It is still a non productive cough. Is this normal?

## 2016-03-12 ENCOUNTER — Ambulatory Visit (INDEPENDENT_AMBULATORY_CARE_PROVIDER_SITE_OTHER): Payer: BLUE CROSS/BLUE SHIELD

## 2016-03-12 DIAGNOSIS — Z23 Encounter for immunization: Secondary | ICD-10-CM

## 2016-03-26 ENCOUNTER — Ambulatory Visit: Payer: BLUE CROSS/BLUE SHIELD | Admitting: Internal Medicine

## 2016-04-07 NOTE — Telephone Encounter (Signed)
-----   Message from Anastasio AuerbachAlba Miles sent at 04/07/2016  1:11 PM EST -----  Regarding: NP,Payne/Telephone  Pt is requesting a callback to discuss if X-rays needs to be done Reinaldo Raddleryor to her f/up appt on 04/13/16 and also if she needs an order to have it done.    Best [phone]

## 2016-04-08 ENCOUNTER — Encounter

## 2016-04-08 NOTE — Telephone Encounter (Signed)
Could you please let her know that she does not need a repeat CXR?    We do not do CXR for follow up if the patient clinically is better, it can take several weeks for the CXR to normalize even though the pna has resolved, which is why we dont do them    Thank you!

## 2016-04-08 NOTE — Telephone Encounter (Signed)
Returned International aid/development workercall    Spoke with patient    Patient feeling much better, "100% better than I was", although still not "breathing like I should be" and still with cough although not as bad as it was     Example given- Got short of breath walking up and down the stairs packing up christmas decorations    Is working full time and taking the stairs at work and does fine with this    Has not yet gone back to exercising, previously exercising daily.    Likely deconditioned    Will get a repeat CXR    Encouraged deep breathing, singing, incentive spirometer use.      Traci SermonSusanna S Lacee Grey, NP

## 2016-04-08 NOTE — Telephone Encounter (Signed)
Follow up call to [name] - Pt verified self and birth date for privacy precautions. Patient was advised of SSP,NP's message. Pt verbalized confusion stating "Annia FriendlySusanna is the one that told me I needed to do the repeat CXR 6 weeks after the last CXR. Now, she's saying not to do it. I'm very confused."     Nurse tried to explain, per SSP,NP's note, that if she was clinically better then a repeat was not needed. [name]  states that she is back at work and slightly doing better but she does not feel her breathing has become better. She would like further explanation of why this is no longer needed.

## 2016-04-12 ENCOUNTER — Inpatient Hospital Stay: Admit: 2016-04-12 | Payer: BLUE CROSS/BLUE SHIELD | Primary: Family

## 2016-04-12 DIAGNOSIS — J189 Pneumonia, unspecified organism: Secondary | ICD-10-CM

## 2016-04-12 NOTE — Progress Notes (Signed)
Discussed plan with Dr. Debroah Loopu    CXR in 4 weeks to ensure resolution    Reviewed with patient.  Patient states that she continues to improve and feel better, although still gets mildly short of breath with stairs, etc    Encouraged coughing/deep breathing exercises as tolerated    Will see tomorrow in clinic for further evaluation    Traci SermonSusanna S [place] Langstaff, NP

## 2016-04-13 ENCOUNTER — Ambulatory Visit: Admit: 2016-04-13 | Payer: PRIVATE HEALTH INSURANCE | Attending: Family | Primary: Family

## 2016-04-13 DIAGNOSIS — J189 Pneumonia, unspecified organism: Secondary | ICD-10-CM

## 2016-04-13 MED ORDER — ALBUTEROL SULFATE HFA 90 MCG/ACTUATION AEROSOL INHALER
90 mcg/actuation | RESPIRATORY_TRACT | 0 refills | Status: DC | PRN
Start: 2016-04-13 — End: 2016-12-27

## 2016-04-13 MED ORDER — FLUTICASONE-SALMETEROL 250 MCG-50 MCG/DOSE DISK DEVICE FOR INHALATION
250-50 mcg/dose | Freq: Two times a day (BID) | RESPIRATORY_TRACT | 0 refills | Status: DC
Start: 2016-04-13 — End: 2016-12-27

## 2016-04-13 NOTE — Patient Instructions (Signed)
1.  Start using Advair Inhaler, 1 Puff 2x/day    2.  Use albuterol inhaler only if needed for wheezing or shortness of breath    3.  Call and schedule an appointment with [facility]

## 2016-04-13 NOTE — Progress Notes (Signed)
Anna Miles is a 61 y.o. female    Chief Complaint   Patient presents with   ??? Cough     f/u     1. Have you been to the ER, urgent care clinic since your last visit?  Hospitalized since your last visit?No    2. Have you seen or consulted any other health care providers outside of the [facility] since your last visit?  Include any pap smears or colon screening. No     Visit Vitals   ??? BP 108/70 (BP 1 Location: Right arm, BP Patient Position: Sitting)   ??? Pulse 66   ??? Temp 98.2 ??F (36.8 ??C) (Oral)   ??? Resp 16   ??? Ht 5' 6.53" (1.69 m)   ??? Wt 116 lb 12.8 oz (53 kg)   ??? SpO2 98%   ??? BMI 18.55 kg/m2       Health Maintenance Due   Topic Date Due   ??? Hepatitis C Screening  [birth year]   ??? FOBT Q 1 YEAR AGE 18-75  10/28/2004   ??? ZOSTER VACCINE AGE 65>  08/29/2014

## 2016-04-13 NOTE — Progress Notes (Signed)
Subjective:      Anna Miles is a 61 y.o. female who presents today for follow up of PNA  ??  02/17/2016 CXR showing RML and RLL PNA  Complete levofloxacin x 10 days    Was seen in clinic 03/01/16 for f/u, felt much better but still with some fatigue and DOE  03/01/16 CXR showing "Improvement in the appearance of the chest. Follow-up to document complete  resolution is necessary to exclude the possibility of an underlying lung lesion.  The pneumonia has decreased"    Repeat CXR 04/08/16 showing "resolution of the right lower lobe pneumonia.Marland KitchenMarland KitchenPersistent opacities in right middle lobe which have decreased.Marland KitchenMarland KitchenThere is also  some volume loss in the right middle lobe which is similar to the previous exam.  Follow-up to complete resolution is necessary"    Patient continues to feel improvement, but still with a tightness in her lungs, discomfort with deep breaths, and dyspnea with walking up and down multiple flights of stairs    Prior to initial PNA, would work out doing spin classes, body pump, etc at 6am 5 days a week.  Has yet to work out  ??  Felt that she was wheezing yesterday, has never wheezed before.  No h/o asthma or COPD  Never a smoker    No hemoptysis  No chest pain    Patient Active Problem List    Diagnosis Date Noted   ??? Unspecified vitamin D deficiency 09/25/2012   ??? Herniated disc      Current Outpatient Prescriptions   Medication Sig Dispense Refill   ??? aspirin 81 mg chewable tablet Take 81 mg by mouth daily.     ??? calcium-cholecalciferol, d3, (CALCIUM 600 + D) 600-125 mg-unit tab Take  by mouth.     ??? fluticasone-salmeterol (ADVAIR) 250-50 mcg/dose diskus inhaler Take 1 Puff by inhalation two (2) times a day. Indications: unresolving PNA 1 Inhaler 0   ??? albuterol (PROVENTIL HFA, VENTOLIN HFA, PROAIR HFA) 90 mcg/actuation inhaler Take 1 Puff by inhalation every four (4) hours as needed for Wheezing. 1 Inhaler 0   ??? Omega-3 Fatty Acids 60-90-500 mg cpDR Take 3 Caps by mouth daily.      ??? cycloSPORINE (RESTASIS) 0.05 % ophthalmic emulsion Administer 1 Drop to both eyes two (2) times a day.          Review of Systems    Pertinent items are noted in HPI.     Objective:     Visit Vitals   ??? BP 108/70 (BP 1 Location: Right arm, BP Patient Position: Sitting)   ??? Pulse 66   ??? Temp 98.2 ??F (36.8 ??C) (Oral)   ??? Resp 16   ??? Ht 5' 6.53" (1.69 m)   ??? Wt 116 lb 12.8 oz (53 kg)   ??? SpO2 98%   ??? BMI 18.55 kg/m2     General appearance: alert, cooperative, no distress, appears stated age  Head: Normocephalic, without obvious abnormality, atraumatic  Lungs: nonlabored respirations, occasional nonproductive cough, inspiratory wheezing LLL, RML and RLL  Heart: regular rate and rhythm, S1, S2 normal, no murmur, click, rub or gallop  Skin: Skin color, texture, turgor normal. No rashes or lesions  Neurologic: Grossly normal    Assessment/Plan:     1. Pneumonia due to infectious organism, unspecified laterality, unspecified part of lung  - improving, however patient still not yet back to baseline.  Now with new inspiratory wheezing  - referral to Pulmonary given, patient instructed to call and  schedule an appointment for evaluation  - start Advair BID.  Albuterol inhaler prn.  - XR CHEST PA LAT; Future, 4-6 weeks   - [facility]  - fluticasone-salmeterol (ADVAIR) 250-50 mcg/dose diskus inhaler; Take 1 Puff by inhalation two (2) times a day. Indications: unresolving PNA  Dispense: 1 Inhaler; Refill: 0  - albuterol (PROVENTIL HFA, VENTOLIN HFA, PROAIR HFA) 90 mcg/actuation inhaler; Take 1 Puff by inhalation every four (4) hours as needed for Wheezing.  Dispense: 1 Inhaler; Refill: 0    2. Inspiratory wheeze on examination  - see above      Advised her to call back or return to office if symptoms worsen/change/persist.  Discussed expected course/resolution/complications of diagnosis in detail with patient.    Medication risks/benefits/costs/interactions/alternatives discussed with patient.   She was given an after visit summary which includes diagnoses, current medications, & vitals.  She expressed understanding with the diagnosis and plan.    Chelsea PrimusSusanna Mikeila Burgen, FNP

## 2016-04-14 LAB — CBC WITH AUTOMATED DIFF
ABS. BASOPHILS: 0 10*3/uL (ref 0.0–0.2)
ABS. EOSINOPHILS: 0.2 10*3/uL (ref 0.0–0.4)
ABS. IMM. GRANS.: 0 10*3/uL (ref 0.0–0.1)
ABS. MONOCYTES: 0.6 10*3/uL (ref 0.1–0.9)
ABS. NEUTROPHILS: 2.9 10*3/uL (ref 1.4–7.0)
Abs Lymphocytes: 3.5 10*3/uL — ABNORMAL HIGH (ref 0.7–3.1)
BASOPHILS: 0 %
EOSINOPHILS: 2 %
HCT: 37.1 % (ref 34.0–46.6)
HGB: 11.8 g/dL (ref 11.1–15.9)
IMMATURE GRANULOCYTES: 0 %
Lymphocytes: 49 %
MCH: 30.7 pg (ref 26.6–33.0)
MCHC: 31.8 g/dL (ref 31.5–35.7)
MCV: 97 fL (ref 79–97)
MONOCYTES: 9 %
NEUTROPHILS: 40 %
PLATELET: 278 10*3/uL (ref 150–379)
RBC: 3.84 x10E6/uL (ref 3.77–5.28)
RDW: 13.6 % (ref 12.3–15.4)
WBC: 7.1 10*3/uL (ref 3.4–10.8)

## 2016-04-15 NOTE — Telephone Encounter (Signed)
Per patient request, I faxed over the last office notes to Dr [facility] office to be placed in her records.

## 2016-04-20 NOTE — Telephone Encounter (Signed)
Call to pt - she states that the medication prescribed cost $417.00, as she has a high deductible that has to be met. Pt states that she did not fill the medication and prefers to wait until she consults with the pulmonologist at her upcoming appointment on Jan 30th, to see if he/she has different recommendations.     Pt questioned if the requested records were sent to Dr. Neita Goodnight. She was informed that per documentation, the records were sent on Jan 18th.

## 2016-04-20 NOTE — Telephone Encounter (Signed)
-----   Message from Ashok Palliffany N Robinson sent at 04/20/2016 10:45 AM EST -----  Regarding: NP Payne/Telephone  Pt is calling to speak with the nurse regarding her advair medication  that is  $417.00 and did not want to get it filled. She also mentioned that she has an upcoming appt with the pulmonary doctor on Tuesday 04/27/16. Numerous attempts to reach out to  the practice, and pt sent a message through my chart on 1/17.The best contact is [phone].

## 2016-05-25 ENCOUNTER — Inpatient Hospital Stay: Admit: 2016-05-25 | Payer: BLUE CROSS/BLUE SHIELD | Primary: Family

## 2016-05-25 DIAGNOSIS — J189 Pneumonia, unspecified organism: Secondary | ICD-10-CM

## 2016-06-10 ENCOUNTER — Encounter: Payer: Self-pay | Admitting: Internal Medicine

## 2016-06-10 ENCOUNTER — Ambulatory Visit (INDEPENDENT_AMBULATORY_CARE_PROVIDER_SITE_OTHER): Payer: BLUE CROSS/BLUE SHIELD | Admitting: Internal Medicine

## 2016-06-10 VITALS — BP 110/70 | HR 76 | Temp 98.2°F | Wt 144.5 lb

## 2016-06-10 DIAGNOSIS — R3989 Other symptoms and signs involving the genitourinary system: Secondary | ICD-10-CM

## 2016-06-10 DIAGNOSIS — R310 Gross hematuria: Secondary | ICD-10-CM

## 2016-06-10 DIAGNOSIS — R350 Frequency of micturition: Secondary | ICD-10-CM

## 2016-06-10 LAB — POC URINALSYSI DIPSTICK (AUTOMATED)
BILIRUBIN UA: NEGATIVE
GLUCOSE UA: NEGATIVE
NITRITE UA: POSITIVE
Spec Grav, UA: >1.035 — AB (ref 1.030–1.035)
UROBILINOGEN UA: NEGATIVE (ref ?–2.0)
pH, UA: 6 (ref 5.0–8.0)

## 2016-06-10 MED ORDER — CIPROFLOXACIN HCL 500 MG PO TABS: 500.0000 mg | ORAL_TABLET | Freq: Two times a day (BID) | ORAL | 0 refills | Status: DC

## 2016-06-10 NOTE — Progress Notes (Signed)
HPI  Pt presents to the clinic today with c/o urinary frequency, blood in her urine and bladder pressure. She reports symptoms started this morning. She denies fever, chills, nausea or low back pain. She has not tried anything OTC.   Review of Systems  Past Medical History:  Diagnosis Date  . Arthritis    left hip  . GERD (gastroesophageal reflux disease)    occasional     Family History  Problem Relation Age of Onset  . Colon cancer Mother   . Heart failure Mother   . Lung cancer Paternal Grandfather   . Cancer Sister     Glioblastoma  . Breast cancer Neg Hx   . Esophageal cancer Neg Hx   . Stomach cancer Neg Hx     Social History   Social History  . Marital status: Married    Spouse name: N/A  . Number of children: N/A  . Years of education: N/A   Occupational History  . Not on file.   Social History Main Topics  . Smoking status: Never Smoker  . Smokeless tobacco: Never Used  . Alcohol use 5.4 oz/week    9 Glasses of wine per week  . Drug use: No  . Sexual activity: Yes    Birth control/ protection: Post-menopausal   Other Topics Concern  . Not on file   Social History Narrative  . No narrative on file    Allergies  Allergen Reactions  . Penicillins Rash     Constitutional: Denies fever, malaise, fatigue, headache or abrupt weight changes.   GU: Pt reports bladder pressure frequency and blood in urine. Denies burning sensation, odor or discharge. Skin: Denies redness, rashes, lesions or ulcercations.   No other specific complaints in a complete review of systems (except as listed in HPI above).    Objective:   Physical Exam  BP 110/70   Pulse 76   Temp 98.2 F (36.8 C) (Oral)   Wt 144 lb 8 oz (65.5 kg)   SpO2 98%   BMI 24.05 kg/m   Wt Readings from Last 3 Encounters:  06/10/16 144 lb 8 oz (65.5 kg)  01/22/16 158 lb 3.2 oz (71.8 kg)  11/12/15 155 lb (70.3 kg)    General: Appears her stated age, well developed, well nourished in  NAD. Cardiovascular: Normal rate and rhythm. S1,S2 noted.   Pulmonary/Chest: Normal effort and positive vesicular breath sounds. No respiratory distress. No wheezes, rales or ronchi noted.  Abdomen: Soft. Normal bowel sounds. No distention or masses noted.  Tender to palpation over the bladder area. No CVA tenderness.       Assessment & Plan:   Urinary Frequency, Bladder Pressure and Blood in Urine secondary to UTI:  Urinalysis: 3+ leuks, 3+ blood, pos nitrites Will send urine culture eRx sent if for Cipro 500 mg BID x 5 days OK to take AZO OTC Drink plenty of fluids  RTC as needed or if symptoms persist. Webb Silversmith, NP

## 2016-06-10 NOTE — Addendum Note (Signed)
Addended by: Lurlean Nanny on: 06/10/2016 04:21 PM   Modules accepted: Orders

## 2016-06-10 NOTE — Patient Instructions (Signed)

## 2016-06-11 ENCOUNTER — Ambulatory Visit: Payer: BLUE CROSS/BLUE SHIELD | Admitting: Primary Care

## 2016-06-11 ENCOUNTER — Ambulatory Visit: Payer: BLUE CROSS/BLUE SHIELD | Admitting: Family Medicine

## 2016-06-12 LAB — URINE CULTURE

## 2016-06-16 ENCOUNTER — Encounter: Payer: Self-pay | Admitting: Internal Medicine

## 2016-06-17 ENCOUNTER — Encounter: Payer: Self-pay | Admitting: Internal Medicine

## 2016-06-17 ENCOUNTER — Ambulatory Visit (INDEPENDENT_AMBULATORY_CARE_PROVIDER_SITE_OTHER): Payer: BLUE CROSS/BLUE SHIELD | Admitting: Internal Medicine

## 2016-06-17 VITALS — BP 114/70 | HR 66 | Temp 97.9°F | Wt 145.5 lb

## 2016-06-17 DIAGNOSIS — Z87440 Personal history of urinary (tract) infections: Secondary | ICD-10-CM

## 2016-06-17 LAB — POC URINALSYSI DIPSTICK (AUTOMATED)
BILIRUBIN UA: NEGATIVE
GLUCOSE UA: NEGATIVE
KETONES UA: NEGATIVE
Leukocytes, UA: NEGATIVE
Nitrite, UA: NEGATIVE
PH UA: 6 (ref 5.0–8.0)
Protein, UA: NEGATIVE
RBC UA: NEGATIVE
SPEC GRAV UA: 1.015 (ref 1.030–1.035)
Urobilinogen, UA: NEGATIVE (ref ?–2.0)

## 2016-06-17 NOTE — Addendum Note (Signed)
Addended by: Lurlean Nanny on: 06/17/2016 01:18 PM   Modules accepted: Orders

## 2016-06-17 NOTE — Patient Instructions (Signed)
Asymptomatic Bacteriuria Asymptomatic bacteriuria is the presence of a large number of bacteria in the urine without the usual symptoms of burning or frequent urination. What are the causes? This condition is caused by an increase in bacteria in the urine. This increase can be caused by:  Bacteria entering the urinary tract, such as during sex.  A blockage in the urinary tract, such as from kidney stones or a tumor.  Bladder problems that prevent the bladder from emptying.  What increases the risk? You are more likely to develop this condition if:  You have diabetes mellitus.  You are an elderly adult, especially if you are also in a long-term care facility.  You are pregnant and in the first trimester.  You have kidney stones.  You are female.  You have had a kidney transplant.  You have a leaky kidney tube valve (reflux).  You had a urinary catheter for a long period of time.  What are the signs or symptoms? There are no symptoms of this condition. How is this diagnosed? This condition is diagnosed with a urine test. Because this condition does not cause symptoms, it is usually diagnosed when a urine sample is taken to treat or diagnose another condition, such as pregnancy or kidney problems. Most women who are in their first trimester of pregnancy are screened for asymptomatic bacteriuria. How is this treated? Usually, treatment is not needed for this condition. Treating the condition can lead to other problems, such as a yeast infection or the growth of bacteria that do not respond to treatment (antibiotic-resistant bacteria). Some people, such as pregnant women and people with kidney transplants, do need treatment with antibiotic medicines to prevent kidney infection (pyelonephritis). In pregnant women, kidney infection can lead to premature labor, fetal growth restriction, or newborn death. Follow these instructions at home: Medicines  Take over-the-counter and  prescription medicines only as told by your health care provider.  If you were prescribed an antibiotic medicine, take it as told by your health care provider. Do not stop taking the antibiotic even if you start to feel better. General instructions  Monitor your condition for any changes.  Drink enough fluid to keep your urine clear or pale yellow.  Go to the bathroom more often to keep your bladder empty.  If you are female, keep the area around your vagina and rectum clean. Wipe yourself from front to back after urinating.  Keep all follow-up visits as told by your health care provider. This is important. Contact a health care provider if:  You notice any new symptoms, such as back pain or burning while urinating. Get help right away if:  You develop signs of an infection such as: ? A burning sensation when you urinate. ? Have pain when you urinate. ? Develop an intense need to urinate. ? Urinating more frequently. ? Back pain or pelvic pain. ? Fever or chills.  You have blood in your urine.  Your urine becomes discolored or cloudy.  Your urine smells bad.  You have severe pain that cannot be controlled with medicine. Summary  Asymptomatic bacteriuria is the presence of a large number of bacteria in the urine without the usual symptoms of burning or frequent urination.  Usually, treatment is not needed for this condition. Treating the condition can lead to other problems, such as too much yeast and the growth of antibiotic-resistant bacteria.  Some people, such as pregnant women and people with kidney transplants, do need treatment with antibiotic medicines to prevent   kidney infection (pyelonephritis).  If you were prescribed an antibiotic medicine, take it as told by your health care provider. Do not stop taking the antibiotic even if you start to feel better. This information is not intended to replace advice given to you by your health care provider. Make sure you  discuss any questions you have with your health care provider. Document Released: 03/15/2005 Document Revised: 03/09/2016 Document Reviewed: 03/09/2016 Elsevier Interactive Patient Education  2017 Elsevier Inc.  

## 2016-06-17 NOTE — Progress Notes (Signed)
Subjective:    Patient ID: Shelby Griffith, female    DOB: November 16, 1955, 61 y.o.   MRN: [id number]  HPI  Pt presents to the clinic today to followup UTI. She was seen 06/10/16 for the same. She was started on Cipro 500 mg x 5 days, and has completed this course. Her urine culture grew out [facility] sensitive to Cipro. She just wants to make sure she is clear of the infection at this time. She denies urgency, frequency, dysuria or blood in her urine.   Review of Systems      Past Medical History:  Diagnosis Date  . Arthritis    left hip  . GERD (gastroesophageal reflux disease)    occasional     Current Outpatient Prescriptions  Medication Sig Dispense Refill  . calcium carbonate (TUMS - DOSED IN MG ELEMENTAL CALCIUM) 500 MG chewable tablet Chew 2 tablets by mouth as needed for indigestion or heartburn.    . cholecalciferol (VITAMIN D) 1000 units tablet Take 2,000 Units by mouth daily.     No current facility-administered medications for this visit.     Allergies  Allergen Reactions  . Penicillins Rash    Family History  Problem Relation Age of Onset  . Colon cancer Mother   . Heart failure Mother   . Lung cancer Paternal Grandfather   . Cancer Sister     Glioblastoma  . Breast cancer Neg Hx   . Esophageal cancer Neg Hx   . Stomach cancer Neg Hx     Social History   Social History  . Marital status: Married    Spouse name: N/A  . Number of children: N/A  . Years of education: N/A   Occupational History  . Not on file.   Social History Main Topics  . Smoking status: Never Smoker  . Smokeless tobacco: Never Used  . Alcohol use 5.4 oz/week    9 Glasses of wine per week  . Drug use: No  . Sexual activity: Yes    Birth control/ protection: Post-menopausal   Other Topics Concern  . Not on file   Social History Narrative  . No narrative on file     Constitutional: Denies fever, malaise, fatigue, headache or abrupt weight changes.  GU: Denies urgency,  frequency, pain with urination, burning sensation, blood in urine, odor or discharge.  No other specific complaints in a complete review of systems (except as listed in HPI above).  Objective:   Physical Exam  Pulse 66   Temp 97.9 F (36.6 C) (Oral)   Wt 145 lb 8 oz (66 kg)   SpO2 98%   BMI 24.21 kg/m  Wt Readings from Last 3 Encounters:  06/17/16 145 lb 8 oz (66 kg)  06/10/16 144 lb 8 oz (65.5 kg)  01/22/16 158 lb 3.2 oz (71.8 kg)    General: Appears her stated age, well developed, well nourished in NAD. Abdomen: Soft and nontender. Normal bowel sounds.    BMET    Component Value Date/Time   NA 140 11/12/2015 1638   K 3.9 11/12/2015 1638   CL 102 11/12/2015 1638   CO2 28 11/12/2015 1638   GLUCOSE 77 11/12/2015 1638   BUN 10 11/12/2015 1638   CREATININE 0.70 11/12/2015 1638   CALCIUM 9.8 11/12/2015 1638    Lipid Panel     Component Value Date/Time   CHOL 225 (H) 11/25/2015 0905   TRIG 53.0 11/25/2015 0905   HDL 70.90 11/25/2015 0905   CHOLHDL 3  11/25/2015 0905   VLDL 10.6 11/25/2015 0905   LDLCALC 144 (H) 11/25/2015 0905    CBC    Component Value Date/Time   WBC 5.2 11/12/2015 1638   RBC 4.16 11/12/2015 1638   HGB 13.3 11/12/2015 1638   HCT 39.7 11/12/2015 1638   PLT 220.0 11/12/2015 1638   MCV 95.4 11/12/2015 1638   MCHC 33.5 11/12/2015 1638   RDW 13.1 11/12/2015 1638    Hgb A1C No results found for: HGBA1C          Assessment & Plan:   History of UTI:  Urinalysis: normal, no leuks, blood or nitrites Advised her she is clear of her infection Discussed s/s of UTI's for future reference  RTC as needed or if symptoms persist or worsen BAITY, REGINA, NP

## 2016-07-02 ENCOUNTER — Encounter: Payer: Self-pay | Admitting: Internal Medicine

## 2016-07-06 NOTE — Telephone Encounter (Signed)
Dottie Please look into this patient's issue with screening colonoscopy. Per the chart she has a family history of colon cancer in her mother. Her last colonoscopy was 2012. She would be due repeat screening colonoscopy at this time. I'm confused as to why this would not be covered by her insurance. Technically it is "higher than average risk" due to family history but not due to her personal history. Her last colonoscopy was normal. The appropriate code would be screening colonoscopy with family history of colon cancer Colonoscopy appears to be due at this time

## 2016-11-16 MED ORDER — PNEUMOCOCCAL 13-VAL CONJ VACCINE-DIP CRM (PF) 0.5 ML IM SYRINGE
0.5 mL | Freq: Once | INTRAMUSCULAR | 0 refills | Status: AC
Start: 2016-11-16 — End: 2016-11-16

## 2016-11-16 NOTE — Telephone Encounter (Signed)
Patient requests an rx for her Prevnar-13 immunization.  We also discussed the Shingrix; she understands there is currently a "shortage" of this vaccine, so she will check w/ her pharmacy the availability.   She recovered from her Pneumonia earlier this year, and has felt well since.  She is very physically active.  She realizes she should be seen once/year for a check-up.

## 2016-11-16 NOTE — Addendum Note (Signed)
Addended by: Traci Sermon on: 11/16/2016 01:03 PM      Modules accepted: Orders

## 2016-11-23 ENCOUNTER — Encounter: Payer: Self-pay | Admitting: Internal Medicine

## 2016-11-25 ENCOUNTER — Encounter: Payer: BLUE CROSS/BLUE SHIELD | Admitting: Internal Medicine

## 2016-12-06 ENCOUNTER — Encounter: Payer: Self-pay | Admitting: Internal Medicine

## 2016-12-06 ENCOUNTER — Ambulatory Visit (INDEPENDENT_AMBULATORY_CARE_PROVIDER_SITE_OTHER): Payer: BLUE CROSS/BLUE SHIELD | Admitting: Internal Medicine

## 2016-12-06 VITALS — BP 112/74 | HR 81 | Temp 98.1°F | Ht 65.0 in | Wt 145.5 lb

## 2016-12-06 DIAGNOSIS — Z9009 Acquired absence of other part of head and neck: Secondary | ICD-10-CM

## 2016-12-06 DIAGNOSIS — Z0000 Encounter for general adult medical examination without abnormal findings: Secondary | ICD-10-CM | POA: Diagnosis not present

## 2016-12-06 DIAGNOSIS — E890 Postprocedural hypothyroidism: Secondary | ICD-10-CM

## 2016-12-06 DIAGNOSIS — Z1211 Encounter for screening for malignant neoplasm of colon: Secondary | ICD-10-CM

## 2016-12-06 LAB — CBC
HEMATOCRIT: 40.2 % (ref 36.0–46.0)
HEMOGLOBIN: 13.2 g/dL (ref 12.0–15.0)
MCHC: 33 g/dL (ref 30.0–36.0)
MCV: 97.3 fl (ref 78.0–100.0)
PLATELETS: 214 10*3/uL (ref 150.0–400.0)
RBC: 4.13 Mil/uL (ref 3.87–5.11)
RDW: 13.2 % (ref 11.5–15.5)
WBC: 4.5 10*3/uL (ref 4.0–10.5)

## 2016-12-06 LAB — LIPID PANEL
CHOL/HDL RATIO: 3
Cholesterol: 218 mg/dL — ABNORMAL HIGH (ref 0–200)
HDL: 73.9 mg/dL (ref >39.00)
LDL CALC: 131 mg/dL — AB (ref 0–99)
NonHDL: 144.12
TRIGLYCERIDES: 68 mg/dL (ref 0.0–149.0)
VLDL: 13.6 mg/dL (ref 0.0–40.0)

## 2016-12-06 LAB — COMPREHENSIVE METABOLIC PANEL
ALBUMIN: 4.2 g/dL (ref 3.5–5.2)
ALT: 10 U/L (ref 0–35)
AST: 16 U/L (ref 0–37)
Alkaline Phosphatase: 50 U/L (ref 39–117)
BUN: 14 mg/dL (ref 6–23)
CALCIUM: 9.5 mg/dL (ref 8.4–10.5)
CHLORIDE: 102 meq/L (ref 96–112)
CO2: 29 meq/L (ref 19–32)
Creatinine, Ser: 0.73 mg/dL (ref 0.40–1.20)
GFR: 85.83 mL/min (ref >60.00)
Glucose, Bld: 91 mg/dL (ref 70–99)
POTASSIUM: 4 meq/L (ref 3.5–5.1)
Sodium: 141 mEq/L (ref 135–145)
Total Bilirubin: 0.7 mg/dL (ref 0.2–1.2)
Total Protein: 6.6 g/dL (ref 6.0–8.3)

## 2016-12-06 LAB — VITAMIN D 25 HYDROXY (VIT D DEFICIENCY, FRACTURES): VITD: 31.46 ng/mL (ref 30.00–100.00)

## 2016-12-06 LAB — T3, FREE: T3, Free: 3.1 pg/mL (ref 2.3–4.2)

## 2016-12-06 LAB — TSH: TSH: 3.29 u[IU]/mL (ref 0.35–4.50)

## 2016-12-06 LAB — T4, FREE: Free T4: 1.09 ng/dL (ref 0.60–1.60)

## 2016-12-06 NOTE — Patient Instructions (Signed)
Health Maintenance for Postmenopausal Women Menopause is a normal process in which your reproductive ability comes to an end. This process happens gradually over a span of months to years, usually between the ages of 22 and 9. Menopause is complete when you have missed 12 consecutive menstrual periods. It is important to talk with your health care provider about some of the most common conditions that affect postmenopausal women, such as heart disease, cancer, and bone loss (osteoporosis). Adopting a healthy lifestyle and getting preventive care can help to promote your health and wellness. Those actions can also lower your chances of developing some of these common conditions. What should I know about menopause? During menopause, you may experience a number of symptoms, such as:  Moderate-to-severe hot flashes.  Night sweats.  Decrease in sex drive.  Mood swings.  Headaches.  Tiredness.  Irritability.  Memory problems.  Insomnia.  Choosing to treat or not to treat menopausal changes is an individual decision that you make with your health care provider. What should I know about hormone replacement therapy and supplements? Hormone therapy products are effective for treating symptoms that are associated with menopause, such as hot flashes and night sweats. Hormone replacement carries certain risks, especially as you become older. If you are thinking about using estrogen or estrogen with progestin treatments, discuss the benefits and risks with your health care provider. What should I know about heart disease and stroke? Heart disease, heart attack, and stroke become more likely as you age. This may be due, in part, to the hormonal changes that your body experiences during menopause. These can affect how your body processes dietary fats, triglycerides, and cholesterol. Heart attack and stroke are both medical emergencies. There are many things that you can do to help prevent heart disease  and stroke:  Have your blood pressure checked at least every 1-2 years. High blood pressure causes heart disease and increases the risk of stroke.  If you are 53-22 years old, ask your health care provider if you should take aspirin to prevent a heart attack or a stroke.  Do not use any tobacco products, including cigarettes, chewing tobacco, or electronic cigarettes. If you need help quitting, ask your health care provider.  It is important to eat a healthy diet and maintain a healthy weight. ? Be sure to include plenty of vegetables, fruits, low-fat dairy products, and lean protein. ? Avoid eating foods that are high in solid fats, added sugars, or salt (sodium).  Get regular exercise. This is one of the most important things that you can do for your health. ? Try to exercise for at least 150 minutes each week. The type of exercise that you do should increase your heart rate and make you sweat. This is known as moderate-intensity exercise. ? Try to do strengthening exercises at least twice each week. Do these in addition to the moderate-intensity exercise.  Know your numbers.Ask your health care provider to check your cholesterol and your blood glucose. Continue to have your blood tested as directed by your health care provider.  What should I know about cancer screening? There are several types of cancer. Take the following steps to reduce your risk and to catch any cancer development as early as possible. Breast Cancer  Practice breast self-awareness. ? This means understanding how your breasts normally appear and feel. ? It also means doing regular breast self-exams. Let your health care provider know about any changes, no matter how small.  If you are 40  or older, have a clinician do a breast exam (clinical breast exam or CBE) every year. Depending on your age, family history, and medical history, it may be recommended that you also have a yearly breast X-ray (mammogram).  If you  have a family history of breast cancer, talk with your health care provider about genetic screening.  If you are at high risk for breast cancer, talk with your health care provider about having an MRI and a mammogram every year.  Breast cancer (BRCA) gene test is recommended for women who have family members with BRCA-related cancers. Results of the assessment will determine the need for genetic counseling and BRCA1 and for BRCA2 testing. BRCA-related cancers include these types: ? Breast. This occurs in males or females. ? Ovarian. ? Tubal. This may also be called fallopian tube cancer. ? Cancer of the abdominal or pelvic lining (peritoneal cancer). ? Prostate. ? Pancreatic.  Cervical, Uterine, and Ovarian Cancer Your health care provider may recommend that you be screened regularly for cancer of the pelvic organs. These include your ovaries, uterus, and vagina. This screening involves a pelvic exam, which includes checking for microscopic changes to the surface of your cervix (Pap test).  For women ages 21-65, health care providers may recommend a pelvic exam and a Pap test every three years. For women ages 79-65, they may recommend the Pap test and pelvic exam, combined with testing for human papilloma virus (HPV), every five years. Some types of HPV increase your risk of cervical cancer. Testing for HPV may also be done on women of any age who have unclear Pap test results.  Other health care providers may not recommend any screening for nonpregnant women who are considered low risk for pelvic cancer and have no symptoms. Ask your health care provider if a screening pelvic exam is right for you.  If you have had past treatment for cervical cancer or a condition that could lead to cancer, you need Pap tests and screening for cancer for at least 20 years after your treatment. If Pap tests have been discontinued for you, your risk factors (such as having a new sexual partner) need to be  reassessed to determine if you should start having screenings again. Some women have medical problems that increase the chance of getting cervical cancer. In these cases, your health care provider may recommend that you have screening and Pap tests more often.  If you have a family history of uterine cancer or ovarian cancer, talk with your health care provider about genetic screening.  If you have vaginal bleeding after reaching menopause, tell your health care provider.  There are currently no reliable tests available to screen for ovarian cancer.  Lung Cancer Lung cancer screening is recommended for adults 69-62 years old who are at high risk for lung cancer because of a history of smoking. A yearly low-dose CT scan of the lungs is recommended if you:  Currently smoke.  Have a history of at least 30 pack-years of smoking and you currently smoke or have quit within the past 15 years. A pack-year is smoking an average of one pack of cigarettes per day for one year.  Yearly screening should:  Continue until it has been 15 years since you quit.  Stop if you develop a health problem that would prevent you from having lung cancer treatment.  Colorectal Cancer  This type of cancer can be detected and can often be prevented.  Routine colorectal cancer screening usually begins at  age 42 and continues through age 45.  If you have risk factors for colon cancer, your health care provider may recommend that you be screened at an earlier age.  If you have a family history of colorectal cancer, talk with your health care provider about genetic screening.  Your health care provider may also recommend using home test kits to check for hidden blood in your stool.  A small camera at the end of a tube can be used to examine your colon directly (sigmoidoscopy or colonoscopy). This is done to check for the earliest forms of colorectal cancer.  Direct examination of the colon should be repeated every  5-10 years until age 71. However, if early forms of precancerous polyps or small growths are found or if you have a family history or genetic risk for colorectal cancer, you may need to be screened more often.  Skin Cancer  Check your skin from head to toe regularly.  Monitor any moles. Be sure to tell your health care provider: ? About any new moles or changes in moles, especially if there is a change in a mole's shape or color. ? If you have a mole that is larger than the size of a pencil eraser.  If any of your family members has a history of skin cancer, especially at a young age, talk with your health care provider about genetic screening.  Always use sunscreen. Apply sunscreen liberally and repeatedly throughout the day.  Whenever you are outside, protect yourself by wearing long sleeves, pants, a wide-brimmed hat, and sunglasses.  What should I know about osteoporosis? Osteoporosis is a condition in which bone destruction happens more quickly than new bone creation. After menopause, you may be at an increased risk for osteoporosis. To help prevent osteoporosis or the bone fractures that can happen because of osteoporosis, the following is recommended:  If you are 46-71 years old, get at least 1,000 mg of calcium and at least 600 mg of vitamin D per day.  If you are older than age 55 but younger than age 65, get at least 1,200 mg of calcium and at least 600 mg of vitamin D per day.  If you are older than age 54, get at least 1,200 mg of calcium and at least 800 mg of vitamin D per day.  Smoking and excessive alcohol intake increase the risk of osteoporosis. Eat foods that are rich in calcium and vitamin D, and do weight-bearing exercises several times each week as directed by your health care provider. What should I know about how menopause affects my mental health? Depression may occur at any age, but it is more common as you become older. Common symptoms of depression  include:  Low or sad mood.  Changes in sleep patterns.  Changes in appetite or eating patterns.  Feeling an overall lack of motivation or enjoyment of activities that you previously enjoyed.  Frequent crying spells.  Talk with your health care provider if you think that you are experiencing depression. What should I know about immunizations? It is important that you get and maintain your immunizations. These include:  Tetanus, diphtheria, and pertussis (Tdap) booster vaccine.  Influenza every year before the flu season begins.  Pneumonia vaccine.  Shingles vaccine.  Your health care provider may also recommend other immunizations. This information is not intended to replace advice given to you by your health care provider. Make sure you discuss any questions you have with your health care provider. Document Released: 05/07/2005  Document Revised: 10/03/2015 Document Reviewed: 12/17/2014 Elsevier Interactive Patient Education  [employer].

## 2016-12-06 NOTE — Progress Notes (Signed)
Subjective:    Patient ID: Shelby Griffith, female    DOB: 10-Jul-1955, 61 y.o.   MRN: [id number]  HPI  Pt presents to the clinic today for her annual exam.  Flu: 02/2016 Tetanus: 02/2009 Shingrix: never Pap Smear: 2016, normal Mammogram: 11/2015 Colon Screening: 05/2010, 5 years Vision screening: annually Dentist: annually  Diet: She eats lean meat. She consumes fruits and veggies daily. She avoids fried foods. She drinks mostly water. Exercise: She walks multiple days per week.  Review of Systems      Past Medical History:  Diagnosis Date  . Arthritis    left hip  . GERD (gastroesophageal reflux disease)    occasional     Current Outpatient Prescriptions  Medication Sig Dispense Refill  . calcium carbonate (TUMS - DOSED IN MG ELEMENTAL CALCIUM) 500 MG chewable tablet Chew 2 tablets by mouth as needed for indigestion or heartburn.    . cholecalciferol (VITAMIN D) 1000 units tablet Take 2,000 Units by mouth daily.     No current facility-administered medications for this visit.     Allergies  Allergen Reactions  . Penicillins Rash    Family History  Problem Relation Age of Onset  . Colon cancer Mother   . Heart failure Mother   . Lung cancer Paternal Grandfather   . Cancer Sister        Glioblastoma  . Breast cancer Neg Hx   . Esophageal cancer Neg Hx   . Stomach cancer Neg Hx     Social History   Social History  . Marital status: Married    Spouse name: N/A  . Number of children: N/A  . Years of education: N/A   Occupational History  . Not on file.   Social History Main Topics  . Smoking status: Never Smoker  . Smokeless tobacco: Never Used  . Alcohol use 5.4 oz/week    9 Glasses of wine per week  . Drug use: No  . Sexual activity: Yes    Birth control/ protection: Post-menopausal   Other Topics Concern  . Not on file   Social History Narrative  . No narrative on file     Constitutional: Denies fever, malaise, fatigue, headache or  abrupt weight changes.  HEENT: Denies eye pain, eye redness, ear pain, ringing in the ears, wax buildup, runny nose, nasal congestion, bloody nose, or sore throat. Respiratory: Denies difficulty breathing, shortness of breath, cough or sputum production.   Cardiovascular: Denies chest pain, chest tightness, palpitations or swelling in the hands or feet.  Gastrointestinal: Denies abdominal pain, bloating, constipation, diarrhea or blood in the stool.  GU: Denies urgency, frequency, pain with urination, burning sensation, blood in urine, odor or discharge. Musculoskeletal: Denies decrease in range of motion, difficulty with gait, muscle pain or joint pain and swelling.  Skin: Denies redness, rashes, lesions or ulcercations.  Neurological: Denies dizziness, difficulty with memory, difficulty with speech or problems with balance and coordination.  Psych: Denies anxiety, depression, SI/HI.  No other specific complaints in a complete review of systems (except as listed in HPI above).  Objective:   Physical Exam   BP 112/74   Pulse 81   Temp 98.1 F (36.7 C) (Oral)   Ht 5\' 5"  (1.651 m)   Wt 145 lb 8 oz (66 kg)   SpO2 99%   BMI 24.21 kg/m  Wt Readings from Last 3 Encounters:  12/06/16 145 lb 8 oz (66 kg)  06/17/16 145 lb 8 oz (66 kg)  06/10/16  144 lb 8 oz (65.5 kg)    General: Appears her stated age, well developed, well nourished in NAD. Skin: Warm, dry and intact. [name] HEENT: Head: normal shape and size; Eyes: sclera white, no icterus, conjunctiva pink, PERRLA and EOMs intact; Ears: Tm's gray and intact, normal light reflex; Throat/Mouth: Teeth present, mucosa pink and moist, no exudate, lesions or ulcerations noted.  Neck:  Neck supple, trachea midline. No masses, lumps or thyromegaly present. Partial thyroidectomy. Cardiovascular: Normal rate and rhythm. S1,S2 noted.  No murmur, rubs or gallops noted. No JVD or BLE edema. No carotid bruits noted. Pulmonary/Chest: Normal effort and  positive vesicular breath sounds. No respiratory distress. No wheezes, rales or ronchi noted.  Abdomen: Soft and nontender. Normal bowel sounds. No distention or masses noted. Liver, spleen and kidneys non palpable. Musculoskeletal: Strength 5/5 BUE/BLE. No difficulty with gait.  Neurological: Alert and oriented. Cranial nerves II-XII grossly intact. Coordination normal.  Psychiatric: Mood and affect normal. Behavior is normal. Judgment and thought content normal.   BMET    Component Value Date/Time   NA 140 11/12/2015 1638   K 3.9 11/12/2015 1638   CL 102 11/12/2015 1638   CO2 28 11/12/2015 1638   GLUCOSE 77 11/12/2015 1638   BUN 10 11/12/2015 1638   CREATININE 0.70 11/12/2015 1638   CALCIUM 9.8 11/12/2015 1638    Lipid Panel     Component Value Date/Time   CHOL 225 (H) 11/25/2015 0905   TRIG 53.0 11/25/2015 0905   HDL 70.90 11/25/2015 0905   CHOLHDL 3 11/25/2015 0905   VLDL 10.6 11/25/2015 0905   LDLCALC 144 (H) 11/25/2015 0905    CBC    Component Value Date/Time   WBC 5.2 11/12/2015 1638   RBC 4.16 11/12/2015 1638   HGB 13.3 11/12/2015 1638   HCT 39.7 11/12/2015 1638   PLT 220.0 11/12/2015 1638   MCV 95.4 11/12/2015 1638   MCHC 33.5 11/12/2015 1638   RDW 13.1 11/12/2015 1638    Hgb A1C No results found for: HGBA1C         Assessment & Plan:   Preventative Health Maintenance:  Encouraged her to get a flu shot in the fall Tetanus UTD She declines shingles vaccine Pap smear UTD She will call to schedule her mammogram Referral placed to GI for screening colonoscopy Encouraged her to consume a balanced diet and exercise regimen Advised her to see an eye doctor and dentist annually Will check CBC, CMET, Lipid, Vit D, TSH , Free T3, Free T4  RTC in 1 year, sooner if needed Webb Silversmith, NP

## 2016-12-17 ENCOUNTER — Other Ambulatory Visit: Payer: Self-pay | Admitting: Internal Medicine

## 2016-12-17 DIAGNOSIS — Z1231 Encounter for screening mammogram for malignant neoplasm of breast: Secondary | ICD-10-CM

## 2016-12-21 ENCOUNTER — Encounter: Payer: Self-pay | Admitting: Internal Medicine

## 2016-12-27 ENCOUNTER — Ambulatory Visit: Admit: 2016-12-27 | Discharge: 2016-12-27 | Payer: PRIVATE HEALTH INSURANCE | Attending: Family | Primary: Family

## 2016-12-27 DIAGNOSIS — Z0000 Encounter for general adult medical examination without abnormal findings: Secondary | ICD-10-CM

## 2016-12-27 MED ORDER — VARICELLA-ZOSTER GLYCOE VACC-AS01B ADJ(PF) 50 MCG/0.5 ML IM SUSPENSION
50 mcg/0.5 mL | Freq: Once | INTRAMUSCULAR | 0 refills | Status: AC
Start: 2016-12-27 — End: 2016-12-27

## 2016-12-27 NOTE — Progress Notes (Signed)
Subjective:      Anna Miles is a 61 y.o. female who presents today for CPE    Hist PNA: last year  saw Dr. Lucienne Capers 3 times last year  Never used advair  No recent albuterol use    Vit d deficiency  Taking calcium-vit d    Osteopenia:  Last DEXA 2017, done [facility], ordered by OBGYN Dr. 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 Marilynn Latino  Last mammogram: 12/23/16 at [facility]  Last DEXA:  12/17/2015  Last colonoscopy: UTD 12/20/2016, Dr. Charlsie Quest.  Repeat in 10 years  Last tetanus vaccination : UTD  Last pneumonia vaccination :has had prevnar 13, needs 2018  Last influenza vaccination: will get at work  Shingles vaccination: requesting  Advanced Directives:patient counseled on use and need of an Scientist, water quality.  The [place] Advanced Directive for Healthcare template given to patient for review and completion.  Patient asked to provide [place] with a copy once it is completed.      Patient Active Problem List    Diagnosis Date Noted   ??? Unspecified vitamin D deficiency 09/25/2012   ??? Herniated disc      Current Outpatient Prescriptions   Medication Sig Dispense Refill   ??? polysorbate 80/glycerin (REFRESH DRY EYE THERAPY OP) Apply  to eye.     ??? aspirin 81 mg chewable tablet Take 81 mg by mouth daily.     ??? calcium-cholecalciferol, d3, (CALCIUM 600 + D) 600-125 mg-unit tab Take  by mouth.     ??? Omega-3 Fatty Acids 60-90-500 mg cpDR Take 3 Caps by mouth daily.     ??? cycloSPORINE (RESTASIS) 0.05 % ophthalmic emulsion Administer 1 Drop to both eyes two (2) times a day.          Review of Systems    Pertinent items are noted in HPI.     Objective:     Visit Vitals    ??? BP 102/64 (BP 1 Location: Left arm, BP Patient Position: Sitting)   ??? Pulse 67   ??? Temp 99.1 ??F (37.3 ??C) (Oral)   ??? Resp 15   ??? Ht 5' 5.5" (1.664 m)   ??? Wt 122 lb (55.3 kg)   ??? SpO2 99%   ??? BMI 19.99 kg/m2     General:  Alert, cooperative, no distress, appears stated age.   Head:  Normocephalic, without obvious abnormality, atraumatic.   Eyes:  Conjunctivae/corneas clear. PERRL, EOMs intact.    Ears:  Normal TMs and external ear canals both ears.   Nose: Nares normal. Septum midline. Mucosa normal. No drainage or sinus tenderness.   Throat: Lips, mucosa, and tongue normal. Teeth and gums normal.   Neck: Supple, symmetrical, trachea midline, no adenopathy, thyroid: no enlargement/tenderness/nodules, no carotid bruit and no JVD.   Back:   Symmetric, no curvature. ROM normal. No CVA tenderness.   Lungs:   Clear to auscultation bilaterally.   Chest wall:  No tenderness or deformity.   Heart:  Regular rate and rhythm, S1, S2 normal, no murmur, click, rub or gallop.   Abdomen:   Soft, non-tender. Bowel sounds normal. No masses,  No  organomegaly.   Extremities: Extremities normal, atraumatic, no cyanosis or edema.   Pulses: 2+ and symmetric all extremities.   Skin: Skin color, texture, turgor normal. No rashes or lesions.   Lymph nodes: Cervical, supraclavicular nodes normal.   Neurologic: CNII-XII intact. Normal strength, sensation throughout.       Assessment/Plan:     1. Routine general medical examination at a health care facility  - HM reviewed and updated  - AMB POC EKG ROUTINE W/ 12 LEADS, INTER & REP  - METABOLIC PANEL, COMPREHENSIVE  - CBC WITH AUTOMATED DIFF  - TSH RFX ON ABNORMAL TO FREE T4  - VITAMIN D, 25 HYDROXY    2. Osteopenia of multiple sites  - DEXA due 2019  - cont weight bearing exercise and calcium-vit d daily  - VITAMIN D, 25 HYDROXY    3. Fatigue, unspecified type  - CBC WITH AUTOMATED DIFF  - TSH RFX ON ABNORMAL TO FREE T4  - VITAMIN D, 25 HYDROXY    4. History of colonoscopy   - CBC WITH AUTOMATED DIFF    5. Encounter for immunization  - varicella-zoster recombinant, PF, (SHINGRIX, PF,) 50 mcg/0.5 mL susr injection; 0.5 mL by IntraMUSCular route once for 1 dose. Indications: PREVENTION OF HERPES ZOSTER  Dispense: 0.5 mL; Refill: 0    6. Family history of heart disease  - cont healthy diet and active lifestlye  - LIPID PANEL  - CT HEART W/O CONT WITH CALCIUM; Future    7. Screening for viral disease  - HEPATITIS C AB        Advised her to call back or return to office if symptoms worsen/change/persist.  Discussed expected course/resolution/complications of diagnosis in detail with patient.    Medication risks/benefits/costs/interactions/alternatives discussed with patient.  She was given an after visit summary which includes diagnoses, current medications, & vitals.  She expressed understanding with the diagnosis and plan.    Chelsea PrimusSusanna Pamela Maddy, FNP

## 2016-12-27 NOTE — Patient Instructions (Signed)
Schedule CT: Heart w/o calcium, [phone] is the scheduling phone number

## 2016-12-27 NOTE — Progress Notes (Signed)
Reviewed record in preparation for visit and have obtained necessary documentation.    Identified pt with two pt identifiers(name and DOB).      Health Maintenance Due   Topic   ??? Hepatitis C Screening    ??? PAP AKA CERVICAL CYTOLOGY    ??? Shingrix Vaccine Age 62> (1 of 2)   ??? BREAST CANCER SCRN MAMMOGRAM    ??? FOBT Q 1 YEAR AGE 71-75    ??? Influenza Age 639 to Adult          Chief Complaint   Patient presents with   ??? Complete Physical     w/ EKG. Pt reports her colonoscopy was completed 09/24 by Dr. Parke SimmersBland and [facility]        Wt Readings from Last 3 Encounters:   12/27/16 122 lb (55.3 kg)   04/13/16 116 lb 12.8 oz (53 kg)   03/01/16 108 lb (49 kg)     Temp Readings from Last 3 Encounters:   12/27/16 99.1 ??F (37.3 ??C) (Oral)   04/13/16 98.2 ??F (36.8 ??C) (Oral)   03/01/16 99.3 ??F (37.4 ??C)     BP Readings from Last 3 Encounters:   12/27/16 102/64   04/13/16 108/70   03/01/16 112/66     Pulse Readings from Last 3 Encounters:   12/27/16 67   04/13/16 66   03/01/16 90           Learning Assessment:  :     Learning Assessment 12/27/2016 03/01/2016 09/25/2012   PRIMARY LEARNER Patient Patient Patient   HIGHEST LEVEL OF EDUCATION - PRIMARY LEARNER  4 YEARS OF COLLEGE 4 YEARS OF COLLEGE 4 YEARS OF COLLEGE   BARRIERS PRIMARY LEARNER NONE NONE NONE   CO-LEARNER CAREGIVER No No No   PRIMARY LANGUAGE ENGLISH ENGLISH ENGLISH   INTERPRETER NEED - - No   LEARNER PREFERENCE PRIMARY READING READING OTHER (COMMENT)   LEARNING SPECIAL TOPICS - - no   ANSWERED BY self patient patient   RELATIONSHIP SELF SELF SELF   ASSESSMENT COMMENT - - none       Depression Screening:  :     PHQ over the last two weeks 12/27/2016   Little interest or pleasure in doing things Not at all   Feeling down, depressed, irritable, or hopeless Not at all   Total Score PHQ 2 0       Fall Risk Assessment:  :     No flowsheet data found.    Abuse Screening:  :     Abuse Screening Questionnaire 12/27/2016 03/01/2016 09/25/2012    Do you ever feel afraid of your partner? N N N   Are you in a relationship with someone who physically or mentally threatens you? N N N   Is it safe for you to go home? Jerrel IvoryY Y Y       Coordination of Care Questionnaire:  :     1) Have you been to an emergency room, urgent care clinic since your last visit? no   Hospitalized since your last visit? no             2) Have you seen or consulted any other health care providers outside of [facility] since your last visit? yes  (Include any pap smears or colon screenings in this section.)    3) Do you have an Advance Directive on file? no

## 2016-12-28 LAB — VITAMIN D, 25 HYDROXY: VITAMIN D, 25-HYDROXY: 43.7 ng/mL (ref 30.0–100.0)

## 2016-12-28 LAB — CBC WITH AUTOMATED DIFF
ABS. BASOPHILS: 0 10*3/uL (ref 0.0–0.2)
ABS. EOSINOPHILS: 0.1 10*3/uL (ref 0.0–0.4)
ABS. IMM. GRANS.: 0 10*3/uL (ref 0.0–0.1)
ABS. MONOCYTES: 0.7 10*3/uL (ref 0.1–0.9)
ABS. NEUTROPHILS: 3.3 10*3/uL (ref 1.4–7.0)
Abs Lymphocytes: 2.8 10*3/uL (ref 0.7–3.1)
BASOPHILS: 0 %
EOSINOPHILS: 1 %
HCT: 38.1 % (ref 34.0–46.6)
HGB: 12.8 g/dL (ref 11.1–15.9)
IMMATURE GRANULOCYTES: 0 %
Lymphocytes: 41 %
MCH: 32.2 pg (ref 26.6–33.0)
MCHC: 33.6 g/dL (ref 31.5–35.7)
MCV: 96 fL (ref 79–97)
MONOCYTES: 10 %
NEUTROPHILS: 48 %
PLATELET: 266 10*3/uL (ref 150–379)
RBC: 3.97 x10E6/uL (ref 3.77–5.28)
RDW: 13.5 % (ref 12.3–15.4)
WBC: 6.9 10*3/uL (ref 3.4–10.8)

## 2016-12-28 LAB — TSH RFX ON ABNORMAL TO FREE T4: TSH: 2.36 u[IU]/mL (ref 0.450–4.500)

## 2016-12-28 LAB — METABOLIC PANEL, COMPREHENSIVE
A-G Ratio: 2 (ref 1.2–2.2)
ALT (SGPT): 12 IU/L (ref 0–32)
AST (SGOT): 19 IU/L (ref 0–40)
Albumin: 4.9 g/dL — ABNORMAL HIGH (ref 3.6–4.8)
Alk. phosphatase: 74 IU/L (ref 39–117)
BUN/Creatinine ratio: 28 (ref 12–28)
BUN: 21 mg/dL (ref 8–27)
Bilirubin, total: 0.3 mg/dL (ref 0.0–1.2)
CO2: 21 mmol/L (ref 20–29)
Calcium: 9.8 mg/dL (ref 8.7–10.3)
Chloride: 101 mmol/L (ref 96–106)
Creatinine: 0.75 mg/dL (ref 0.57–1.00)
GFR est AA: 99 mL/min/{1.73_m2} (ref >59)
GFR est non-AA: 86 mL/min/{1.73_m2} (ref >59)
GLOBULIN, TOTAL: 2.4 g/dL (ref 1.5–4.5)
Glucose: 87 mg/dL (ref 65–99)
Potassium: 4.2 mmol/L (ref 3.5–5.2)
Protein, total: 7.3 g/dL (ref 6.0–8.5)
Sodium: 141 mmol/L (ref 134–144)

## 2016-12-29 NOTE — Progress Notes (Signed)
12/29/16  Results sent to patient via MyChart  Traci SermonSusanna S Lilygrace Rodick, NP

## 2016-12-30 ENCOUNTER — Ambulatory Visit
Admission: RE | Admit: 2016-12-30 | Discharge: 2016-12-30 | Disposition: A | Discharge status: Home / Self Care | Payer: BLUE CROSS/BLUE SHIELD | Source: Ambulatory Visit | Attending: Internal Medicine | Admitting: Internal Medicine

## 2016-12-30 DIAGNOSIS — Z1231 Encounter for screening mammogram for malignant neoplasm of breast: Secondary | ICD-10-CM | POA: Diagnosis not present

## 2017-01-04 ENCOUNTER — Encounter: Payer: Self-pay | Admitting: Internal Medicine

## 2017-01-17 ENCOUNTER — Inpatient Hospital Stay: Admit: 2017-01-17 | Payer: Self-pay | Attending: Family | Primary: Family

## 2017-01-17 DIAGNOSIS — Z136 Encounter for screening for cardiovascular disorders: Secondary | ICD-10-CM

## 2017-01-17 NOTE — Progress Notes (Signed)
01/17/17  Results sent to patient via MyChart  Laiden Milles S Jaken Fregia, NP

## 2017-01-18 ENCOUNTER — Ambulatory Visit (INDEPENDENT_AMBULATORY_CARE_PROVIDER_SITE_OTHER): Payer: BLUE CROSS/BLUE SHIELD

## 2017-01-18 DIAGNOSIS — Z23 Encounter for immunization: Secondary | ICD-10-CM | POA: Diagnosis not present

## 2017-05-30 ENCOUNTER — Telehealth: Payer: Self-pay | Admitting: Gastroenterology

## 2017-05-30 NOTE — Telephone Encounter (Signed)
Pt left vm to set up a colonoscopy

## 2017-05-31 ENCOUNTER — Telehealth: Payer: Self-pay | Admitting: Gastroenterology

## 2017-05-31 NOTE — Telephone Encounter (Signed)
LVM returning patients call to schedule her colonoscopy.

## 2017-05-31 NOTE — Telephone Encounter (Signed)
Patient called back to schedule procedure.

## 2017-06-01 ENCOUNTER — Telehealth: Payer: Self-pay | Admitting: Gastroenterology

## 2017-06-01 ENCOUNTER — Other Ambulatory Visit: Payer: Self-pay

## 2017-06-01 DIAGNOSIS — Z1211 Encounter for screening for malignant neoplasm of colon: Secondary | ICD-10-CM

## 2017-06-01 NOTE — Telephone Encounter (Signed)
Patient is returning your call to schedule a colonoscopy

## 2017-06-01 NOTE — Telephone Encounter (Signed)
Gastroenterology Pre-Procedure Review  Request Date: 06/14/17 Requesting Physician: Dr. Marius Ditch  PATIENT REVIEW QUESTIONS: The patient responded to the following health history questions as indicated:    1. Are you having any GI issues? no 2. Do you have a personal history of Polyps? no 3. Do you have a family history of Colon Cancer or Polyps? no 4. Diabetes Mellitus? no 5. Joint replacements in the past 12 months?no 6. Major health problems in the past 3 months?no 7. Any artificial heart valves, MVP, or defibrillator?no    MEDICATIONS & ALLERGIES:    Patient reports the following regarding taking any anticoagulation/antiplatelet therapy:   Plavix, Coumadin, Eliquis, Xarelto, Lovenox, Pradaxa, Brilinta, or Effient? no Aspirin? no  Patient confirms/reports the following medications:  Current Outpatient Medications  Medication Sig Dispense Refill  . calcium carbonate (TUMS - DOSED IN MG ELEMENTAL CALCIUM) 500 MG chewable tablet Chew 2 tablets by mouth as needed for indigestion or heartburn.    . cholecalciferol (VITAMIN D) 1000 units tablet Take 2,000 Units by mouth daily.     No current facility-administered medications for this visit.     Patient confirms/reports the following allergies:  Allergies  Allergen Reactions  . Penicillins Rash    No orders of the defined types were placed in this encounter.   AUTHORIZATION INFORMATION Primary Insurance: 1D#: Group #:  Secondary Insurance: 1D#: Group #:  SCHEDULE INFORMATION: Date: 06/14/17 Time: Location:[facility]

## 2017-06-14 ENCOUNTER — Ambulatory Visit: Payer: 59 | Admitting: Registered Nurse

## 2017-06-14 ENCOUNTER — Ambulatory Visit
Admission: RE | Admit: 2017-06-14 | Discharge: 2017-06-14 | Disposition: A | Discharge status: Home / Self Care | Payer: 59 | Source: Ambulatory Visit | Attending: Gastroenterology | Admitting: Gastroenterology

## 2017-06-14 ENCOUNTER — Encounter: Payer: Self-pay | Admitting: *Deleted

## 2017-06-14 ENCOUNTER — Other Ambulatory Visit: Payer: Self-pay

## 2017-06-14 ENCOUNTER — Encounter
Admission: RE | Disposition: A | Discharge status: Home / Self Care | Payer: Self-pay | Source: Ambulatory Visit | Attending: Gastroenterology

## 2017-06-14 DIAGNOSIS — Z1211 Encounter for screening for malignant neoplasm of colon: Secondary | ICD-10-CM

## 2017-06-14 DIAGNOSIS — D123 Benign neoplasm of transverse colon: Secondary | ICD-10-CM | POA: Diagnosis not present

## 2017-06-14 DIAGNOSIS — Q439 Congenital malformation of intestine, unspecified: Secondary | ICD-10-CM | POA: Insufficient documentation

## 2017-06-14 DIAGNOSIS — D124 Benign neoplasm of descending colon: Secondary | ICD-10-CM | POA: Diagnosis not present

## 2017-06-14 DIAGNOSIS — K635 Polyp of colon: Secondary | ICD-10-CM | POA: Insufficient documentation

## 2017-06-14 DIAGNOSIS — D122 Benign neoplasm of ascending colon: Secondary | ICD-10-CM | POA: Diagnosis not present

## 2017-06-14 HISTORY — PX: COLONOSCOPY WITH PROPOFOL: SHX5780

## 2017-06-14 SURGERY — COLONOSCOPY WITH PROPOFOL
Anesthesia: General

## 2017-06-14 MED ORDER — SODIUM CHLORIDE 0.9 % IV SOLN
INTRAVENOUS | Status: DC
  Administered 2017-06-14: 13:00:00 via INTRAVENOUS

## 2017-06-14 MED ORDER — PHENYLEPHRINE HCL 10 MG/ML IJ SOLN
INTRAMUSCULAR | Status: DC | PRN
  Administered 2017-06-14 (×3): 50 ug via INTRAVENOUS

## 2017-06-14 MED ORDER — PROPOFOL 10 MG/ML IV BOLUS
INTRAVENOUS | Status: DC | PRN
  Administered 2017-06-14 (×2): 30 mg via INTRAVENOUS
  Administered 2017-06-14: 20 mg via INTRAVENOUS
  Administered 2017-06-14: 30 mg via INTRAVENOUS
  Administered 2017-06-14: 20 mg via INTRAVENOUS

## 2017-06-14 MED ORDER — SPOT INK MARKER SYRINGE KIT
PACK | SUBMUCOSAL | Status: DC | PRN
  Administered 2017-06-14: .75 mL via SUBMUCOSAL

## 2017-06-14 MED ORDER — PROPOFOL 500 MG/50ML IV EMUL
INTRAVENOUS | Status: DC | PRN
  Administered 2017-06-14: 140 ug/kg/min via INTRAVENOUS

## 2017-06-14 MED ORDER — PROPOFOL 500 MG/50ML IV EMUL
INTRAVENOUS | Status: AC
  Filled 2017-06-14: qty 50

## 2017-06-14 NOTE — Anesthesia Preprocedure Evaluation (Addendum)
Anesthesia Evaluation  Patient identified by MRN, date of birth, ID band Patient awake    Reviewed: Allergy & Precautions, NPO status , Patient's Chart, lab work & pertinent test results, reviewed documented beta blocker date and time   History of Anesthesia Complications Negative for: history of anesthetic complications  Airway Mallampati: II  TM Distance: >3 FB Neck ROM: Full    Dental no notable dental hx.    Pulmonary neg pulmonary ROS, neg sleep apnea, neg COPD,    breath sounds clear to auscultation- rhonchi (-) wheezing      Cardiovascular Exercise Tolerance: Good (-) hypertension(-) CAD, (-) Past MI, (-) Cardiac Stents and (-) CABG  Rhythm:Regular Rate:Normal - Systolic murmurs and - Diastolic murmurs    Neuro/Psych negative neurological ROS  negative psych ROS   GI/Hepatic Neg liver ROS, GERD  ,  Endo/Other  negative endocrine ROSneg diabetes  Renal/GU negative Renal ROS     Musculoskeletal  (+) Arthritis ,   Abdominal (+) - obese,   Peds  Hematology negative hematology ROS (+)   Anesthesia Other Findings Past Medical History: No date: Arthritis     Comment:  left hip No date: GERD (gastroesophageal reflux disease)     Comment:  occasional    Reproductive/Obstetrics                            Anesthesia Physical Anesthesia Plan  ASA: II  Anesthesia Plan: General   Post-op Pain Management:    Induction: Intravenous  PONV Risk Score and Plan: 2 and Propofol infusion  Airway Management Planned: Natural Airway  Additional Equipment:   Intra-op Plan:   Post-operative Plan:   Informed Consent: I have reviewed the patients History and Physical, chart, labs and discussed the procedure including the risks, benefits and alternatives for the proposed anesthesia with the patient or authorized representative who has indicated his/her understanding and acceptance.     Plan  Discussed with: CRNA and Anesthesiologist  Anesthesia Plan Comments:        Anesthesia Quick Evaluation

## 2017-06-14 NOTE — Anesthesia Postprocedure Evaluation (Signed)
Anesthesia Post Note  Patient: Shelby Griffith  Procedure(s) Performed: COLONOSCOPY WITH PROPOFOL (N/A )  Patient location during evaluation: Endoscopy Anesthesia Type: General Level of consciousness: awake and alert and oriented Pain management: pain level controlled Vital Signs Assessment: post-procedure vital signs reviewed and stable Respiratory status: spontaneous breathing, nonlabored ventilation and respiratory function stable Cardiovascular status: blood pressure returned to baseline and stable Postop Assessment: no signs of nausea or vomiting Anesthetic complications: no     Last Vitals:  Vitals:   06/14/17 1458 06/14/17 1508  BP: 97/84 122/86  Pulse: 74 73  Resp: 14 19  Temp:    SpO2: 100% 99%    Last Pain:  Vitals:   06/14/17 1428  TempSrc: Tympanic                 Swara Donze

## 2017-06-14 NOTE — Anesthesia Post-op Follow-up Note (Signed)
Anesthesia QCDR form completed.        

## 2017-06-14 NOTE — Op Note (Signed)
[facility] Gastroenterology Patient Name: Shelby Griffith Procedure Date: 06/14/2017 1:25 PM MRN: [id number] Account #: [id number] Date of Birth: 1955-09-24 Admit Type: Outpatient Age: 62 Room: [facility] ENDO ROOM 4 Gender: Female Note Status: Finalized Procedure:            Colonoscopy Indications:          Screening for colorectal malignant neoplasm, Last                        colonoscopy 10 years ago Providers:            Lin Landsman MD, MD Referring MD:         Jearld Fenton (Referring MD) Medicines:            Monitored Anesthesia Care Complications:        No immediate complications. Estimated blood loss:                        Minimal. Procedure:            Pre-Anesthesia Assessment:                       - Prior to the procedure, a History and Physical was                        performed, and patient medications and allergies were                        reviewed. The patient is competent. The risks and                        benefits of the procedure and the sedation options and                        risks were discussed with the patient. All questions                        were answered and informed consent was obtained.                        Patient identification and proposed procedure were                        verified by the physician, the nurse, the                        anesthesiologist, the anesthetist and the technician in                        the pre-procedure area in the procedure room in the                        endoscopy suite. Mental Status Examination: alert and                        oriented. Airway Examination: normal oropharyngeal                        airway and neck mobility. Respiratory Examination:  clear to auscultation. CV Examination: normal.                        Prophylactic Antibiotics: The patient does not require                        prophylactic antibiotics. Prior Anticoagulants:  The                        patient has taken no previous anticoagulant or                        antiplatelet agents. ASA Grade Assessment: II - A                        patient with mild systemic disease. After reviewing the                        risks and benefits, the patient was deemed in                        satisfactory condition to undergo the procedure. The                        anesthesia plan was to use monitored anesthesia care                        (MAC). Immediately prior to administration of                        medications, the patient was re-assessed for adequacy                        to receive sedatives. The heart rate, respiratory rate,                        oxygen saturations, blood pressure, adequacy of                        pulmonary ventilation, and response to care were                        monitored throughout the procedure. The physical status                        of the patient was re-assessed after the procedure.                       After obtaining informed consent, the colonoscope was                        passed under direct vision. Throughout the procedure,                        the patient's blood pressure, pulse, and oxygen                        saturations were monitored continuously. The                        Colonoscope  was introduced through the anus and                        advanced to the the cecum, identified by appendiceal                        orifice and ileocecal valve. The colonoscopy was                        technically difficult and complex due to a tortuous                        colon. Successful completion of the procedure was aided                        by applying abdominal pressure. The patient tolerated                        the procedure well. The quality of the bowel                        preparation was evaluated using the BBPS [facility] Bowel                        Preparation Scale) with scores of: Right  Colon = 3,                        Transverse Colon = 3 and Left Colon = 3 (entire mucosa                        seen well with no residual staining, small fragments of                        stool or opaque liquid). The total BBPS score equals 9. Findings:      The perianal and digital rectal examinations were normal. Pertinent       negatives include normal sphincter tone and no palpable rectal lesions.      Two sessile polyps were found in the ascending colon. The polyps were 6       to 8 mm in size. These polyps were removed with a hot snare. Resection       and retrieval were complete. To prevent bleeding after the polypectomy,       one hemostatic clip was successfully placed. There was no bleeding at       the end of the procedure.      A 8 mm polyp was found in the transverse colon. The polyp was sessile.       The polyp was removed with a hot snare. Resection and retrieval were       complete.      A 5 mm polyp was found in the transverse colon. The polyp was sessile.       The polyp was removed with a cold snare. Resection and retrieval were       complete.      A 5 mm polyp was found in the descending colon. The polyp was sessile.       The polyp was removed with a cold snare. Resection and retrieval were       complete.  A 9 mm polyp was found in the descending colon. The polyp was flat.       Preparations were made for mucosal resection. Chromoscopy with methylene       blue was done to mark the borders of the lesion. Methylene blue was       injected with adequate lift of the lesion from the muscularis propria.       Snare mucosal resection with suction (via the working channel) retrieval       was performed. A 9 mm area was resected. Resection and retrieval were       complete. There was no bleeding during, and at the end, of the       procedure. To close a defect after mucosal resection, one hemostatic       clip was successfully placed. There was no bleeding during,  or at the       end, of the procedure. Area was tattooed with an injection of [place] ink.      The retroflexed view of the distal rectum and anal verge was normal and       showed no anal or rectal abnormalities. Impression:           - Two 6 to 8 mm polyps in the ascending colon, removed                        with a hot snare. Resected and retrieved.                       - One 8 mm polyp in the transverse colon, removed with                        a hot snare. Resected and retrieved. Clip (MR                        conditional) was placed.                       - One 5 mm polyp in the transverse colon, removed with                        a cold snare. Resected and retrieved.                       - One 5 mm polyp in the descending colon, removed with                        a cold snare. Resected and retrieved.                       - One 9 mm polyp in the descending colon, removed with                        mucosal resection. Resected and retrieved. Clip was                        placed. Tattooed.                       - The distal rectum and anal verge are normal on  retroflexion view.                       - Mucosal resection was performed. Resection and                        retrieval were complete. Recommendation:       - Discharge patient to home (with spouse).                       - Resume previous diet today.                       - Continue present medications.                       - Await pathology results.                       - Repeat colonoscopy in 3 years for surveillance of                        multiple polyps. Procedure Code(s):    --- Professional ---                       [phone], 59, Colonoscopy, flexible; with endoscopic                        mucosal resection                       [phone], Colonoscopy, flexible; with removal of tumor(s),                        polyp(s), or other lesion(s) by snare technique Diagnosis Code(s):    ---  Professional ---                       Z12.11, Encounter for screening for malignant neoplasm                        of colon                       D12.2, Benign neoplasm of ascending colon                       D12.3, Benign neoplasm of transverse colon (hepatic                        flexure or splenic flexure)                       D12.4, Benign neoplasm of descending colon CPT copyright 2016 American Medical Association. All rights reserved. The codes documented in this report are preliminary and upon coder review may  be revised to meet current compliance requirements. Dr. Ulyess Mort Lin Landsman MD, MD 06/14/2017 2:29:29 PM This report has been signed electronically. Number of Addenda: 0 Note Initiated On: 06/14/2017 1:25 PM Scope Withdrawal Time: 0 hours 45 minutes 47 seconds  Total Procedure Duration: 0 hours 51 minutes 26 seconds       [facility]

## 2017-06-14 NOTE — H&P (Signed)
Cephas Darby, MD [address]  [place], [place] [address]  Main: [phone]  Fax: [phone] Pager: [phone]  Primary Care Physician:  Jearld Fenton, NP Primary Gastroenterologist:  Dr. Cephas Darby  Pre-Procedure History & Physical: HPI:  Shelby Griffith is a 62 y.o. female is here for an colonoscopy.   Past Medical History:  Diagnosis Date  . Arthritis    left hip  . GERD (gastroesophageal reflux disease)    occasional     Past Surgical History:  Procedure Laterality Date  . CESAREAN SECTION     x 3  . ENDOMETRIAL ABLATION    . THYROID LOBECTOMY Right 1986  . thyroid lobectomy Right   . TONSILLECTOMY  1977  . VARICOSE VEIN SURGERY Bilateral     Prior to Admission medications   Medication Sig Start Date End Date Taking? Authorizing Provider  calcium carbonate (TUMS - DOSED IN MG ELEMENTAL CALCIUM) 500 MG chewable tablet Chew 2 tablets by mouth as needed for indigestion or heartburn.    [provider]  cholecalciferol (VITAMIN D) 1000 units tablet Take 2,000 Units by mouth daily.    [provider]    Allergies as of 06/01/2017 - Review Complete 12/06/2016  Allergen Reaction Noted  . Penicillins Rash 05/09/2015    Family History  Problem Relation Age of Onset  . Heart failure Mother   . Lung cancer Paternal Grandfather   . Cancer Sister        Glioblastoma  . Breast cancer Neg Hx   . Esophageal cancer Neg Hx   . Stomach cancer Neg Hx     Social History   Socioeconomic History  . Marital status: Married    Spouse name: Not on file  . Number of children: Not on file  . Years of education: Not on file  . Highest education level: Not on file  Social Needs  . Financial resource strain: Not on file  . Food insecurity - worry: Not on file  . Food insecurity - inability: Not on file  . Transportation needs - medical: Not on file  . Transportation needs - non-medical: Not on file  Occupational History  .  Not on file  Tobacco Use  . Smoking status: Never Smoker  . Smokeless tobacco: Never Used  Substance and Sexual Activity  . Alcohol use: Yes    Alcohol/week: 5.4 oz    Types: 9 Glasses of wine per week  . Drug use: No  . Sexual activity: Yes    Birth control/protection: Post-menopausal  Other Topics Concern  . Not on file  Social History Narrative  . Not on file    Review of Systems: See HPI, otherwise negative ROS  Physical Exam: BP 118/75   Pulse 75   Temp 97.8 F (36.6 C) (Tympanic)   Resp 16   Ht 5\' 5"  (1.651 m)   Wt 145 lb (65.8 kg)   SpO2 100%   BMI 24.13 kg/m  General:   Alert,  pleasant and cooperative in NAD Head:  Normocephalic and atraumatic. Neck:  Supple; no masses or thyromegaly. Lungs:  Clear throughout to auscultation.    Heart:  Regular rate and rhythm. Abdomen:  Soft, nontender and nondistended. Normal bowel sounds, without guarding, and without rebound.   Neurologic:  Alert and  oriented x4;  grossly normal neurologically.  Impression/Plan: Shelby Griffith is here for an colonoscopy to be performed for colon cancer screening  Risks, benefits, limitations, and alternatives regarding  colonoscopy have been reviewed with the patient.  Questions have been answered.  All parties agreeable.   Sherri Sear, MD  06/14/2017, 2:30 PM

## 2017-06-14 NOTE — Transfer of Care (Signed)
Immediate Anesthesia Transfer of Care Note  Patient: Shelby Griffith  Procedure(s) Performed: COLONOSCOPY WITH PROPOFOL (N/A )  Patient Location: PACU  Anesthesia Type:General  Level of Consciousness: awake, alert  and oriented  Airway & Oxygen Therapy: Patient Spontanous Breathing and Patient connected to nasal cannula oxygen  Post-op Assessment: Report given to RN  Post vital signs: Reviewed and stable  Last Vitals:  Vitals:   06/14/17 1247  BP: 118/75  Pulse: 75  Resp: 16  Temp: (!) 35.7 C  SpO2: 100%    Last Pain:  Vitals:   06/14/17 1247  TempSrc: Tympanic         Complications: No apparent anesthesia complications

## 2017-06-15 ENCOUNTER — Encounter: Payer: Self-pay | Admitting: Gastroenterology

## 2017-06-15 NOTE — Addendum Note (Signed)
Addendum  created 06/15/17 0954 by Philbert Riser, CRNA   Intraprocedure Staff edited

## 2017-06-16 ENCOUNTER — Telehealth: Payer: Self-pay | Admitting: Gastroenterology

## 2017-06-16 LAB — SURGICAL PATHOLOGY

## 2017-06-16 NOTE — Telephone Encounter (Signed)
Patient is feeling gassy and bloated, no bowel movement after colonoscopy on Tuesday. Per Panya, patient needs to use Miralax. If it doesn't help she will call us back.

## 2017-06-17 ENCOUNTER — Encounter: Payer: Self-pay | Admitting: Gastroenterology

## 2017-07-06 ENCOUNTER — Ambulatory Visit: Admit: 2017-07-06 | Discharge: 2017-07-06 | Payer: PRIVATE HEALTH INSURANCE | Attending: Family | Primary: Family

## 2017-07-06 DIAGNOSIS — Z01419 Encounter for gynecological examination (general) (routine) without abnormal findings: Secondary | ICD-10-CM | POA: Diagnosis not present

## 2017-07-06 DIAGNOSIS — Z124 Encounter for screening for malignant neoplasm of cervix: Secondary | ICD-10-CM | POA: Diagnosis not present

## 2017-07-06 DIAGNOSIS — Z1231 Encounter for screening mammogram for malignant neoplasm of breast: Secondary | ICD-10-CM | POA: Diagnosis not present

## 2017-07-06 DIAGNOSIS — J189 Pneumonia, unspecified organism: Secondary | ICD-10-CM

## 2017-07-06 LAB — HM PAP SMEAR: HM Pap smear: NORMAL

## 2017-07-06 NOTE — Progress Notes (Signed)
Subjective:      Anna Miles is a 62 y.o. female who [facility] [facility]    Per patient, [facility].  Started on levofloxacin 500mg  daily x 10 days    4/3 didn't feel great at night.  Felt tired and like she needed to sleep.    4/4 didn't feel great, but went to work.  Went home early, had fever  [place], temp went up to 102F.  Started taking ibuprofen every 6 hours and would feelt better temporarily  4/6 felt better in the morning, 2pm started feeling poorly again, took more ibuprofen and perked up  4/7 came back home, went to [facility] that evening    CXR showed RLL PNA, WBC 7.3  Started on levofloxacin 500mg  daily x 10 days  Improving  Cough has improved, still tired  No dyspnea  No chest pain or rib pain  No nausea or vomiting    No coughing or choking with eating or drinking    No smoking history  No asthma or copd history    Patient Active Problem List    Diagnosis Date Noted   ??? History of colonoscopy 12/27/2016   ??? Osteopenia of multiple sites 12/27/2016   ??? Unspecified vitamin D deficiency 09/25/2012   ??? Herniated disc      Current Outpatient Medications   Medication Sig Dispense Refill   ??? levoFLOXacin (LEVAQUIN) 500 mg tablet      ??? polysorbate 80/glycerin (REFRESH DRY EYE THERAPY OP) Apply  to eye.     ??? aspirin 81 mg chewable tablet Take 81 mg by mouth daily.     ??? calcium-cholecalciferol, d3, (CALCIUM 600 + D) 600-125 mg-unit tab Take  by mouth.     ??? Omega-3 Fatty Acids 60-90-500 mg cpDR Take 3 Caps by mouth daily.     ??? cycloSPORINE (RESTASIS) 0.05 % ophthalmic emulsion Administer 1 Drop to both eyes two (2) times a day.          Review of Systems    Pertinent items are noted in HPI.     Objective:     Visit Vitals  BP 102/64 (BP 1 Location: Left arm, BP Patient Position: Sitting)   Pulse 68   Temp 98.3 ??F (36.8 ??C) (Oral)   Resp 18   Ht 5' 5.5" (1.664 m)   Wt 125 lb 3.2 oz (56.8 kg)   SpO2 98%    BMI 20.52 kg/m??     General appearance: alert, cooperative, no distress, appears stated age  Head: Normocephalic, without obvious abnormality, atraumatic  Lungs: RLL fine crackles, good airflow throughout, no wheezing or rhonchi, normal effort, no cough  Heart: regular rate and rhythm, S1, S2 normal, no murmur, click, rub or gallop  Extremities: extremities normal, atraumatic, steady gait  Neurologic: Grossly normal    Assessment/Plan:     1. Pneumonia of right lower lobe due to infectious organism (HCC)  - finish levofloxacin 500mg  x 10 days  - repeat cxr in 4 weeks to ensure resolution  - XR CHEST PA LAT; Future      Advised her to call back or return to office if symptoms worsen/change/persist.  Discussed expected course/resolution/complications of diagnosis in detail with patient.    Medication risks/benefits/costs/interactions/alternatives discussed with patient.  She was given an after visit summary which includes diagnoses, current medications, & vitals.  She expressed understanding with the diagnosis  and plan.    Legrand Rams, FNP

## 2017-07-06 NOTE — Patient Instructions (Signed)
Repeat chest xray in 1 month

## 2017-07-06 NOTE — Progress Notes (Signed)
Reviewed record in preparation for visit and have obtained necessary documentation.    Identified pt with two pt identifiers(name and DOB).      Health Maintenance Due   Topic   ??? Hepatitis C Screening    ??? Shingrix Vaccine Age 62> (1 of 2)         Chief Complaint   Patient presents with   ??? ED Follow-up     [facility] DX: Pneumonia 07/03/2017 Started on ABT 07/03/2017 Levaquin 500mg  x 10 days.         Wt Readings from Last 3 Encounters:   07/06/17 125 lb 3.2 oz (56.8 kg)   12/27/16 122 lb (55.3 kg)   04/13/16 116 lb 12.8 oz (53 kg)     Temp Readings from Last 3 Encounters:   12/27/16 99.1 ??F (37.3 ??C) (Oral)   04/13/16 98.2 ??F (36.8 ??C) (Oral)   03/01/16 99.3 ??F (37.4 ??C)     BP Readings from Last 3 Encounters:   12/27/16 102/64   04/13/16 108/70   03/01/16 112/66     Pulse Readings from Last 3 Encounters:   12/27/16 67   04/13/16 66   03/01/16 90           Learning Assessment:  :     Learning Assessment 07/06/2017 12/27/2016 03/01/2016 09/25/2012   PRIMARY LEARNER Patient Patient Patient Patient   HIGHEST LEVEL OF EDUCATION - PRIMARY LEARNER  4 YEARS OF COLLEGE 4 YEARS OF COLLEGE 4 YEARS OF COLLEGE 4 YEARS OF COLLEGE   BARRIERS PRIMARY LEARNER NONE NONE NONE NONE   CO-LEARNER CAREGIVER - No No No   PRIMARY LANGUAGE ENGLISH ENGLISH ENGLISH ENGLISH   INTERPRETER NEED - - - No   LEARNER PREFERENCE PRIMARY DEMONSTRATION READING READING OTHER (COMMENT)   LEARNING SPECIAL TOPICS - - - no   ANSWERED BY patient self patient patient   RELATIONSHIP SELF SELF SELF SELF   ASSESSMENT COMMENT - - - none       Depression Screening:  :     3 most recent PHQ Screens 07/06/2017   Little interest or pleasure in doing things Not at all   Feeling down, depressed, irritable, or hopeless Not at all   Total Score PHQ 2 0       Fall Risk Assessment:  :     Fall Risk Assessment, last 12 mths 07/06/2017   Able to walk? Yes   Fall in past 12 months? No       Abuse Screening:  :      Abuse Screening Questionnaire 07/06/2017 12/27/2016 03/01/2016 09/25/2012   Do you ever feel afraid of your partner? N N N N   Are you in a relationship with someone who physically or mentally threatens you? N N N N   Is it safe for you to go home? Y Y Y Y       Coordination of Care Questionnaire:  :     1) Have you been to an emergency room, urgent care clinic since your last visit? no  Hospitalized since your last visit? no             2) Have you seen or consulted any other health care providers outside of [facility] since your last visit? yes  [facility] 06/2017  (Include any pap smears or colon screenings in this section.)    3) Do you have an Advance Directive on file? no    4) Are you interested  in receiving information on Advance Directives? NO      Patient is accompanied by self I have received verbal consent from Anna Miles to discuss any/all medical information while they are present in the room.

## 2017-08-02 ENCOUNTER — Inpatient Hospital Stay: Admit: 2017-08-02 | Payer: BLUE CROSS/BLUE SHIELD | Attending: Family | Primary: Family

## 2017-08-02 DIAGNOSIS — J181 Lobar pneumonia, unspecified organism: Secondary | ICD-10-CM

## 2017-08-03 NOTE — Progress Notes (Signed)
08/03/17  Results sent to patient via MyChart  Traci Sermon, NP

## 2017-11-23 DIAGNOSIS — H40013 Open angle with borderline findings, low risk, bilateral: Secondary | ICD-10-CM | POA: Diagnosis not present

## 2017-12-21 ENCOUNTER — Other Ambulatory Visit: Payer: Self-pay | Admitting: Internal Medicine

## 2017-12-21 DIAGNOSIS — Z1231 Encounter for screening mammogram for malignant neoplasm of breast: Secondary | ICD-10-CM

## 2017-12-28 ENCOUNTER — Encounter: Attending: Family | Primary: Family

## 2017-12-28 ENCOUNTER — Ambulatory Visit: Admit: 2017-12-28 | Discharge: 2017-12-28 | Payer: PRIVATE HEALTH INSURANCE | Attending: Family | Primary: Family

## 2017-12-28 ENCOUNTER — Ambulatory Visit: Attending: Family | Primary: Family

## 2017-12-28 DIAGNOSIS — Z0000 Encounter for general adult medical examination without abnormal findings: Secondary | ICD-10-CM

## 2017-12-28 NOTE — Progress Notes (Unsigned)
Subjective:      Anna Miles is a 62 y.o. female who presents today for CPE    Osteopenia, Vit D Def:  Last DEXA 2017-[facility]- OBGYN Dr. Marilynn Latinoausch  takes calcium-vit d  Very active, bikes, works out at [employer], used to do body pump  ??  + family history of heart disease in mother and brother  CT Heart w calcium: 12/2016, score 0  ??  Denies any chest pain, palpitations, shortness of breath, cough, abdominal pain, bowel changes, blood in stool, difficulty urinating, headaches, or dizziness  ??  ??  Health maintenance:   Last pap: UTD, 04/2016, Dr. Marilynn Latinoausch  Last [facility]: 12/23/16 at [facility]  Last DEXA:  12/17/2015  Last colonoscopy: UTD 09//18, Dr. Charlsie QuestBrand, due 2024  Last tetanus vaccination : UTD  Last pneumonia vaccination  had prevnar 13, needs pneumococcal 23  Shingrix  Flu Shot  ??  Patient Active Problem List    Diagnosis Date Noted   ??? History of colonoscopy 12/27/2016   ??? Osteopenia of multiple sites 12/27/2016   ??? Unspecified vitamin D deficiency 09/25/2012   ??? Herniated disc      Current Outpatient Medications   Medication Sig Dispense Refill   ??? levoFLOXacin (LEVAQUIN) 500 mg tablet      ??? polysorbate 80/glycerin (REFRESH DRY EYE THERAPY OP) Apply  to eye.     ??? aspirin 81 mg chewable tablet Take 81 mg by mouth daily.     ??? calcium-cholecalciferol, d3, (CALCIUM 600 + D) 600-125 mg-unit tab Take  by mouth.     ??? Omega-3 Fatty Acids 60-90-500 mg cpDR Take 3 Caps by mouth daily.     ??? cycloSPORINE (RESTASIS) 0.05 % ophthalmic emulsion Administer 1 Drop to both eyes two (2) times a day.          Review of Systems    A comprehensive review of systems was negative except for that written in the HPI.     Objective:     There were no vitals taken for this visit.  General:  Alert, cooperative, no distress, appears stated age.   Head:  Normocephalic, without obvious abnormality, atraumatic.   Eyes:  Conjunctivae/corneas clear. PERRL, EOMs intact.   Ears:  Normal TMs and external ear canals both ears.    Nose: Nares normal. Septum midline. Mucosa normal.   Throat: Lips, mucosa, and tongue normal. Teeth and gums normal.   Neck: Supple, symmetrical, trachea midline, no adenopathy, thyroid: no enlargement/tenderness/nodules, no carotid bruit and no JVD.   Back:   Symmetric, no curvature. ROM normal.   Lungs:   Clear to auscultation bilaterally.   Chest wall:  No tenderness or deformity.   Heart:  Regular rate and rhythm, S1, S2 normal, no murmur, click, rub or gallop.   Breast Exam:  No tenderness, masses, or nipple abnormality.   Abdomen:   Soft, non-tender. Bowel sounds normal. No masses,  No organomegaly.   Extremities: Extremities normal, atraumatic, no cyanosis or edema.   Pulses: 2+ and symmetric all extremities.   Skin: Skin color, texture, turgor normal. No rashes or lesions.   Lymph nodes: Cervical, supraclavicular, and axillary nodes normal.   Neurologic: CNII-XII intact. Normal strength, sensation throughout.       Assessment/Plan:     There are no diagnoses linked to this encounter.    Advised her to call back or return to office if symptoms worsen/change/persist.  Discussed expected course/resolution/complications of diagnosis in detail with patient.    Medication  risks/benefits/costs/interactions/alternatives discussed with patient.  She was given an after visit summary which includes diagnoses, current medications, & vitals.  She expressed understanding with the diagnosis and plan.    Legrand Rams, FNP

## 2017-12-28 NOTE — Progress Notes (Signed)
Chief Complaint   Patient presents with   ??? Complete Physical     Reviewed record in preparation for visit and have obtained necessary documentation.    Identified pt with two pt identifiers(name and DOB).      Health Maintenance Due   Topic   ??? Hepatitis C Screening    ??? Shingrix Vaccine Age 62> (1 of 2)   ??? Influenza Age 75 to Adult          Chief Complaint   Patient presents with   ??? Complete Physical        Wt Readings from Last 3 Encounters:   12/28/17 129 lb 3.2 oz (58.6 kg)   07/06/17 125 lb 3.2 oz (56.8 kg)   12/27/16 122 lb (55.3 kg)     Temp Readings from Last 3 Encounters:   12/28/17 98.6 ??F (37 ??C) (Oral)   07/06/17 98.3 ??F (36.8 ??C) (Oral)   12/27/16 99.1 ??F (37.3 ??C) (Oral)     BP Readings from Last 3 Encounters:   12/28/17 110/70   07/06/17 102/64   12/27/16 102/64     Pulse Readings from Last 3 Encounters:   12/28/17 (!) 58   07/06/17 68   12/27/16 67           Learning Assessment:  :     Learning Assessment 07/06/2017 12/27/2016 03/01/2016 09/25/2012   PRIMARY LEARNER Patient Patient Patient Patient   HIGHEST LEVEL OF EDUCATION - PRIMARY LEARNER  4 YEARS OF COLLEGE 4 YEARS OF COLLEGE 4 YEARS OF COLLEGE 4 YEARS OF COLLEGE   BARRIERS PRIMARY LEARNER NONE NONE NONE NONE   CO-LEARNER CAREGIVER - No No No   PRIMARY LANGUAGE ENGLISH ENGLISH ENGLISH ENGLISH   INTERPRETER NEED - - - No   LEARNER PREFERENCE PRIMARY DEMONSTRATION READING READING OTHER (COMMENT)   LEARNING SPECIAL TOPICS - - - no   ANSWERED BY patient self patient patient   RELATIONSHIP SELF SELF SELF SELF   ASSESSMENT COMMENT - - - none       Depression Screening:  :     3 most recent PHQ Screens 07/06/2017   Little interest or pleasure in doing things Not at all   Feeling down, depressed, irritable, or hopeless Not at all   Total Score PHQ 2 0       Fall Risk Assessment:  :     Fall Risk Assessment, last 12 mths 07/06/2017   Able to walk? Yes   Fall in past 12 months? No       Abuse Screening:  :      Abuse Screening Questionnaire 07/06/2017 12/27/2016 03/01/2016 09/25/2012   Do you ever feel afraid of your partner? N N N N   Are you in a relationship with someone who physically or mentally threatens you? N N N N   Is it safe for you to go home? Y Y Y Y       Coordination of Care Questionnaire:  :     1) Have you been to an emergency room, urgent care clinic since your last visit? no   Hospitalized since your last visit? no             2) Have you seen or consulted any other health care providers outside of [facility] since your last visit? no  (Include any pap smears or colon screenings in this section.)    3) Do you have an Advance Directive on file? no  4) Are you interested in receiving information on Advance Directives? NO      Patient is accompanied by self I have received verbal consent from Anna Miles to discuss any/all medical information while they are present in the room.    Reviewed record  In preparation for visit and have obtained necessary documentation.

## 2017-12-28 NOTE — Progress Notes (Signed)
Subjective:      Anna Miles is a 63 y.o. female who presents today for CPE    Vit d deficiency  takes calcium-vit d  ??  Osteopenia:  Last DEXA 2017 at [facility]  takes  Very active, works out every morning at [employer], bikes and yoga  ??  Carotid Stenosis:  Right < 50% in 2017  Takes asa daily  No statin  ??  Denies any chest pain, shortness of breath, cough, abdominal pain, bowel changes, blood in stool, difficulty urinating, headaches, or dizziness  ??  Occasionally at night when laying in bed will feel like her heart is racing faster, not frequent, unsure how long lasts.  Unsure if related to stress  hasnt happened recently    Occasional ankle edema by the end of the day  Usually resolves at night    Gets lipid panel done at work  Gets flu shot at work  ??  Health maintenance:   Last pap: Dr. Marilynn Latino, UTD  Last mammogram: 12/23/16 at [facility], scheduled for 10[facility]4 at [facility]  Last DEXA:  12/17/2015, scheduled for 10/4 at [facility]  Last colonoscopy: UTD 12/20/2016, Dr. Charlsie Quest.  Repeat in 10 years  Last tetanus vaccination : UTD  Last pneumonia vaccination : UTD    Needs Shingrix, is on the wait list??    Patient Active Problem List    Diagnosis Date Noted   ??? Carotid stenosis, right 12/28/2017   ??? History of colonoscopy 12/27/2016   ??? Osteopenia of multiple sites 12/27/2016   ??? Vitamin D deficiency 09/25/2012   ??? Herniated disc      Current Outpatient Medications   Medication Sig Dispense Refill   ??? polysorbate 80/glycerin (REFRESH DRY EYE THERAPY OP) Apply  to eye.     ??? aspirin 81 mg chewable tablet Take 81 mg by mouth daily.     ??? calcium-cholecalciferol, d3, (CALCIUM 600 + D) 600-125 mg-unit tab Take  by mouth.     ??? Omega-3 Fatty Acids 60-90-500 mg cpDR Take 3 Caps by mouth daily.     ??? cycloSPORINE (RESTASIS) 0.05 % ophthalmic emulsion Administer 1 Drop to both eyes two (2) times a day.          Review of Systems    A comprehensive review of systems was negative except for that written in the HPI.     Objective:      Visit Vitals  BP 110/70 (BP 1 Location: Left arm, BP Patient Position: Sitting)   Pulse (!) 58   Temp 98.6 ??F (37 ??C) (Oral)   Resp 14   Ht 5' 5.5" (1.664 m)   Wt 129 lb 3.2 oz (58.6 kg)   SpO2 98%   BMI 21.17 kg/m??     General:  Alert, cooperative, no distress, appears stated age.   Head:  Normocephalic, without obvious abnormality, atraumatic.   Eyes:  Conjunctivae/corneas clear. PERRL, EOMs intact   Ears:  Normal TMs and external ear canals both ears.   Nose: Nares normal. Septum midline. Mucosa normal.   Throat: Lips, mucosa, and tongue normal. Teeth and gums normal.   Neck: Supple, symmetrical, trachea midline, no adenopathy, thyroid: no enlargement/tenderness/nodules, no carotid bruit and no JVD.   Back:   Symmetric, no curvature. ROM normal. No CVA tenderness.   Lungs:   Clear to auscultation bilaterally.   Chest wall:  No tenderness or deformity.   Heart:  Regular rate and rhythm, S1, S2 normal, no murmur, click, rub  or gallop.   Abdomen:   Soft, non-tender. Bowel sounds normal. No masses,  No organomegaly.   Extremities: Extremities normal, atraumatic, no cyanosis or edema.   Pulses: 2+ and symmetric all extremities.   Skin: Skin color, texture, turgor normal. No rashes or lesions.   Lymph nodes: Cervical, supraclavicular, and axillary nodes normal.   Neurologic: CNII-XII intact. Normal strength, sensation throughout.       Assessment/Plan:     1. Annual physical exam  - reviewed HJ  - AMB POC EKG ROUTINE W/ 12 LEADS, INTER & REP  - CBC WITH AUTOMATED DIFF  - TSH 3RD GENERATION  - VITAMIN D, 25 HYDROXY    2. Osteopenia of multiple sites  - DEXA scheduled for 10/4  - cont weight bearing exercises  - VITAMIN D, 25 HYDROXY    3. Vitamin D deficiency  - cont daily supplement  - VITAMIN D, 25 HYDROXY    4. Carotid stenosis, right  - cont asa      Advised her to call back or return to office if symptoms worsen/change/persist.  Discussed expected course/resolution/complications of diagnosis in detail  with patient.    Medication risks/benefits/costs/interactions/alternatives discussed with patient.  She was given an after visit summary which includes diagnoses, current medications, & vitals.  She expressed understanding with the diagnosis and plan.    Chelsea Primus, FNP

## 2017-12-28 NOTE — Patient Instructions (Addendum)
Have your labs done

## 2017-12-28 NOTE — Progress Notes (Signed)
12/30/17  Results sent to patient via MyChart  Kashana Breach S Eevee Borbon, NP

## 2017-12-28 NOTE — Progress Notes (Signed)
 Chief Complaint   Patient presents with   . Complete Physical     Reviewed record in preparation for visit and have obtained necessary documentation.    Identified pt with two pt identifiers(name and DOB).      Health Maintenance Due   Topic   . Hepatitis C Screening    . Shingrix Vaccine Age 62> (1 of 2)   . Influenza Age 75 to Adult          Chief Complaint   Patient presents with   . Complete Physical        Wt Readings from Last 3 Encounters:   12/28/17 129 lb 3.2 oz (58.6 kg)   07/06/17 125 lb 3.2 oz (56.8 kg)   12/27/16 122 lb (55.3 kg)     Temp Readings from Last 3 Encounters:   12/28/17 98.6 F (37 C) (Oral)   07/06/17 98.3 F (36.8 C) (Oral)   12/27/16 99.1 F (37.3 C) (Oral)     BP Readings from Last 3 Encounters:   12/28/17 110/70   07/06/17 102/64   12/27/16 102/64     Pulse Readings from Last 3 Encounters:   12/28/17 (!) 58   07/06/17 68   12/27/16 67           Learning Assessment:  :     Learning Assessment 07/06/2017 12/27/2016 03/01/2016 09/25/2012   PRIMARY LEARNER Patient Patient Patient Patient   HIGHEST LEVEL OF EDUCATION - PRIMARY LEARNER  4 YEARS OF COLLEGE 4 YEARS OF COLLEGE 4 YEARS OF COLLEGE 4 YEARS OF COLLEGE   BARRIERS PRIMARY LEARNER NONE NONE NONE NONE   CO-LEARNER CAREGIVER - No No No   PRIMARY LANGUAGE ENGLISH ENGLISH ENGLISH ENGLISH   INTERPRETER NEED - - - No   LEARNER PREFERENCE PRIMARY DEMONSTRATION READING READING OTHER (COMMENT)   LEARNING SPECIAL TOPICS - - - no   ANSWERED BY patient self patient patient   RELATIONSHIP SELF SELF SELF SELF   ASSESSMENT COMMENT - - - none       Depression Screening:  :     3 most recent PHQ Screens 07/06/2017   Little interest or pleasure in doing things Not at all   Feeling down, depressed, irritable, or hopeless Not at all   Total Score PHQ 2 0       Fall Risk Assessment:  :     Fall Risk Assessment, last 12 mths 07/06/2017   Able to walk? Yes   Fall in past 12 months? No       Abuse Screening:  :     Abuse Screening Questionnaire 07/06/2017  12/27/2016 03/01/2016 09/25/2012   Do you ever feel afraid of your partner? N N N N   Are you in a relationship with someone who physically or mentally threatens you? N N N N   Is it safe for you to go home? Y Y Y Y       Coordination of Care Questionnaire:  :     1) Have you been to an emergency room, urgent care clinic since your last visit? no   Hospitalized since your last visit? no             2) Have you seen or consulted any other health care providers outside of [facility] since your last visit? no  (Include any pap smears or colon screenings in this section.)    3) Do you have an Advance Directive on file? no  4) Are you interested in receiving information on Advance Directives? NO      Patient is accompanied by self I have received verbal consent from Anna Miles to discuss any/all medical information while they are present in the room.    Reviewed record  In preparation for visit and have obtained necessary documentation.

## 2017-12-28 NOTE — Progress Notes (Signed)
12/30/17  Results sent to patient via MyChart  Traci SermonSusanna S Cade Dashner, NP

## 2017-12-28 NOTE — Progress Notes (Signed)
Subjective:      Anna Miles is a 62 y.o. female who presents today for CPE    Vit d deficiency  takes calcium-vit d  ??  Osteopenia:  Last DEXA 2017 at [facility]  takes  Very active, works out every morning at [employer], bikes and yoga  ??  Carotid Stenosis:  Right < 50% in 2017  Takes asa daily  No statin  ??  Denies any chest pain, shortness of breath, cough, abdominal pain, bowel changes, blood in stool, difficulty urinating, headaches, or dizziness  ??  Occasionally at night when laying in bed will feel like her heart is racing faster, not frequent, unsure how long lasts.  Unsure if related to stress  hasnt happened recently    Occasional ankle edema by the end of the day  Usually resolves at night    Gets lipid panel done at work  Gets flu shot at work  ??  Health maintenance:   Last pap: Dr. Marilynn Latino, UTD  Last mammogram: 12/23/16 at [facility], scheduled for 10/4 at [facility]  Last DEXA:  12/17/2015, scheduled for 10/4 at [facility]  Last colonoscopy: UTD 12/20/2016, Dr. Charlsie Quest.  Repeat in 10 years  Last tetanus vaccination : UTD  Last pneumonia vaccination : UTD    Needs Shingrix, is on the wait list??    Patient Active Problem List    Diagnosis Date Noted   ??? Carotid stenosis, right 12/28/2017   ??? History of colonoscopy 12/27/2016   ??? Osteopenia of multiple sites 12/27/2016   ??? Vitamin D deficiency 09/25/2012   ??? Herniated disc      Current Outpatient Medications   Medication Sig Dispense Refill   ??? polysorbate 80/glycerin (REFRESH DRY EYE THERAPY OP) Apply  to eye.     ??? aspirin 81 mg chewable tablet Take 81 mg by mouth daily.     ??? calcium-cholecalciferol, d3, (CALCIUM 600 + D) 600-125 mg-unit tab Take  by mouth.     ??? Omega-3 Fatty Acids 60-90-500 mg cpDR Take 3 Caps by mouth daily.     ??? cycloSPORINE (RESTASIS) 0.05 % ophthalmic emulsion Administer 1 Drop to both eyes two (2) times a day.          Review of Systems    A comprehensive review of systems was negative except for that written in the HPI.     Objective:      Visit Vitals  BP 110/70 (BP 1 Location: Left arm, BP Patient Position: Sitting)   Pulse (!) 58   Temp 98.6 ??F (37 ??C) (Oral)   Resp 14   Ht 5' 5.5" (1.664 m)   Wt 129 lb 3.2 oz (58.6 kg)   SpO2 98%   BMI 21.17 kg/m??     General:  Alert, cooperative, no distress, appears stated age.   Head:  Normocephalic, without obvious abnormality, atraumatic.   Eyes:  Conjunctivae/corneas clear. PERRL, EOMs intact   Ears:  Normal TMs and external ear canals both ears.   Nose: Nares normal. Septum midline. Mucosa normal.   Throat: Lips, mucosa, and tongue normal. Teeth and gums normal.   Neck: Supple, symmetrical, trachea midline, no adenopathy, thyroid: no enlargement/tenderness/nodules, no carotid bruit and no JVD.   Back:   Symmetric, no curvature. ROM normal. No CVA tenderness.   Lungs:   Clear to auscultation bilaterally.   Chest wall:  No tenderness or deformity.   Heart:  Regular rate and rhythm, S1, S2 normal, no murmur, click, rub  or gallop.   Abdomen:   Soft, non-tender. Bowel sounds normal. No masses,  No organomegaly.   Extremities: Extremities normal, atraumatic, no cyanosis or edema.   Pulses: 2+ and symmetric all extremities.   Skin: Skin color, texture, turgor normal. No rashes or lesions.   Lymph nodes: Cervical, supraclavicular, and axillary nodes normal.   Neurologic: CNII-XII intact. Normal strength, sensation throughout.       Assessment/Plan:     1. Annual physical exam  - reviewed HJ  - AMB POC EKG ROUTINE W/ 12 LEADS, INTER & REP  - CBC WITH AUTOMATED DIFF  - TSH 3RD GENERATION  - VITAMIN D, 25 HYDROXY    2. Osteopenia of multiple sites  - DEXA scheduled for 10/4  - cont weight bearing exercises  - VITAMIN D, 25 HYDROXY    3. Vitamin D deficiency  - cont daily supplement  - VITAMIN D, 25 HYDROXY    4. Carotid stenosis, right  - cont asa      Advised her to call back or return to office if symptoms worsen/change/persist.  Discussed expected course/resolution/complications of diagnosis in detail with  patient.    Medication risks/benefits/costs/interactions/alternatives discussed with patient.  She was given an after visit summary which includes diagnoses, current medications, & vitals.  She expressed understanding with the diagnosis and plan.    Chelsea Primus, FNP

## 2017-12-29 LAB — CBC WITH AUTO DIFFERENTIAL
Basophils %: 0 %
Basophils Absolute: 0 10*3/uL (ref 0.0–0.2)
Eosinophils %: 1 %
Eosinophils Absolute: 0.1 10*3/uL (ref 0.0–0.4)
Granulocyte Absolute Count: 0 10*3/uL (ref 0.0–0.1)
Hematocrit: 37.3 % (ref 34.0–46.6)
Hemoglobin: 12.6 g/dL (ref 11.1–15.9)
Immature Granulocytes: 0 %
Lymphocytes %: 41 %
Lymphocytes Absolute: 2.8 10*3/uL (ref 0.7–3.1)
MCH: 32.4 pg (ref 26.6–33.0)
MCHC: 33.8 g/dL (ref 31.5–35.7)
MCV: 96 fL (ref 79–97)
Monocytes %: 11 %
Monocytes Absolute: 0.7 10*3/uL (ref 0.1–0.9)
Neutrophils %: 47 %
Neutrophils Absolute: 3.2 10*3/uL (ref 1.4–7.0)
Platelets: 248 10*3/uL (ref 150–450)
RBC: 3.89 x10E6/uL (ref 3.77–5.28)
RDW: 12.7 % (ref 12.3–15.4)
WBC: 6.9 10*3/uL (ref 3.4–10.8)

## 2017-12-29 LAB — VITAMIN D 25 HYDROXY: Vit D, 25-Hydroxy: 40.6 ng/mL (ref 30.0–100.0)

## 2017-12-29 LAB — TSH 3RD GENERATION
TSH: 2.27 u[IU]/mL (ref 0.450–4.500)
TSH: 2.27 u[IU]/mL (ref 0.450–4.500)

## 2017-12-29 LAB — CBC WITH AUTOMATED DIFF
ABS. BASOPHILS: 0 10*3/uL (ref 0.0–0.2)
ABS. EOSINOPHILS: 0.1 10*3/uL (ref 0.0–0.4)
ABS. IMM. GRANS.: 0 10*3/uL (ref 0.0–0.1)
ABS. MONOCYTES: 0.7 10*3/uL (ref 0.1–0.9)
ABS. NEUTROPHILS: 3.2 10*3/uL (ref 1.4–7.0)
Abs Lymphocytes: 2.8 10*3/uL (ref 0.7–3.1)
BASOPHILS: 0 %
EOSINOPHILS: 1 %
HCT: 37.3 % (ref 34.0–46.6)
HGB: 12.6 g/dL (ref 11.1–15.9)
IMMATURE GRANULOCYTES: 0 %
Lymphocytes: 41 %
MCH: 32.4 pg (ref 26.6–33.0)
MCHC: 33.8 g/dL (ref 31.5–35.7)
MCV: 96 fL (ref 79–97)
MONOCYTES: 11 %
NEUTROPHILS: 47 %
PLATELET: 248 10*3/uL (ref 150–450)
RBC: 3.89 x10E6/uL (ref 3.77–5.28)
RDW: 12.7 % (ref 12.3–15.4)
WBC: 6.9 10*3/uL (ref 3.4–10.8)

## 2017-12-29 LAB — VITAMIN D, 25 HYDROXY: VITAMIN D, 25-HYDROXY: 40.6 ng/mL (ref 30.0–100.0)

## 2018-01-09 ENCOUNTER — Ambulatory Visit
Admission: RE | Admit: 2018-01-09 | Discharge: 2018-01-09 | Disposition: A | Discharge status: Home / Self Care | Payer: 59 | Source: Ambulatory Visit | Attending: Internal Medicine | Admitting: Internal Medicine

## 2018-01-09 DIAGNOSIS — Z1231 Encounter for screening mammogram for malignant neoplasm of breast: Secondary | ICD-10-CM | POA: Diagnosis present

## 2018-01-26 ENCOUNTER — Ambulatory Visit (INDEPENDENT_AMBULATORY_CARE_PROVIDER_SITE_OTHER): Payer: 59

## 2018-01-26 DIAGNOSIS — Z23 Encounter for immunization: Secondary | ICD-10-CM

## 2018-02-27 DIAGNOSIS — L814 Other melanin hyperpigmentation: Secondary | ICD-10-CM | POA: Diagnosis not present

## 2018-02-27 DIAGNOSIS — D229 Melanocytic nevi, unspecified: Secondary | ICD-10-CM | POA: Diagnosis not present

## 2018-02-27 DIAGNOSIS — L218 Other seborrheic dermatitis: Secondary | ICD-10-CM | POA: Diagnosis not present

## 2018-03-14 ENCOUNTER — Ambulatory Visit (INDEPENDENT_AMBULATORY_CARE_PROVIDER_SITE_OTHER): Payer: 59 | Admitting: Internal Medicine

## 2018-03-14 ENCOUNTER — Encounter: Payer: Self-pay | Admitting: Internal Medicine

## 2018-03-14 VITALS — BP 118/78 | HR 75 | Temp 98.1°F | Ht 65.0 in | Wt 153.0 lb

## 2018-03-14 DIAGNOSIS — Z0000 Encounter for general adult medical examination without abnormal findings: Secondary | ICD-10-CM

## 2018-03-14 DIAGNOSIS — Z9009 Acquired absence of other part of head and neck: Secondary | ICD-10-CM | POA: Diagnosis not present

## 2018-03-14 DIAGNOSIS — E890 Postprocedural hypothyroidism: Secondary | ICD-10-CM

## 2018-03-14 LAB — COMPREHENSIVE METABOLIC PANEL
ALK PHOS: 56 U/L (ref 39–117)
ALT: 13 U/L (ref 0–35)
AST: 21 U/L (ref 0–37)
Albumin: 4.6 g/dL (ref 3.5–5.2)
BUN: 15 mg/dL (ref 6–23)
CALCIUM: 9.6 mg/dL (ref 8.4–10.5)
CO2: 29 mEq/L (ref 19–32)
Chloride: 104 mEq/L (ref 96–112)
Creatinine, Ser: 0.7 mg/dL (ref 0.40–1.20)
GFR: 89.72 mL/min (ref >60.00)
GLUCOSE: 99 mg/dL (ref 70–99)
POTASSIUM: 4.5 meq/L (ref 3.5–5.1)
Sodium: 140 mEq/L (ref 135–145)
TOTAL PROTEIN: 6.9 g/dL (ref 6.0–8.3)
Total Bilirubin: 0.5 mg/dL (ref 0.2–1.2)

## 2018-03-14 LAB — LIPID PANEL
Cholesterol: 231 mg/dL — ABNORMAL HIGH (ref 0–200)
HDL: 74.9 mg/dL (ref >39.00)
LDL Cholesterol: 147 mg/dL — ABNORMAL HIGH (ref 0–99)
NONHDL: 156.05
TRIGLYCERIDES: 45 mg/dL (ref 0.0–149.0)
Total CHOL/HDL Ratio: 3
VLDL: 9 mg/dL (ref 0.0–40.0)

## 2018-03-14 LAB — T4, FREE: Free T4: 0.76 ng/dL (ref 0.60–1.60)

## 2018-03-14 LAB — CBC
HEMATOCRIT: 39.1 % (ref 36.0–46.0)
HEMOGLOBIN: 13.1 g/dL (ref 12.0–15.0)
MCHC: 33.5 g/dL (ref 30.0–36.0)
MCV: 95.4 fl (ref 78.0–100.0)
Platelets: 222 10*3/uL (ref 150.0–400.0)
RBC: 4.1 Mil/uL (ref 3.87–5.11)
RDW: 12.7 % (ref 11.5–15.5)
WBC: 5.7 10*3/uL (ref 4.0–10.5)

## 2018-03-14 LAB — VITAMIN D 25 HYDROXY (VIT D DEFICIENCY, FRACTURES): VITD: 34.01 ng/mL (ref 30.00–100.00)

## 2018-03-14 NOTE — Progress Notes (Signed)
Subjective:    Patient ID: Shelby Griffith, female    DOB: 1956-01-25, 62 y.o.   MRN: [id number]  HPI  Pt presents to the clinic today for her annual exam.  Flu: 12/2017 Tetanus: 01/2018 Shingrix: never Pap Smear: 06/2017 Mammogram: 12/2017 Bone Density: 11/2015 Colon Screening: 05/2017 Vision Screening: annually Dentist: annually  Diet: She does eat meat. She consumes fruits and veggies daily. She does eat fried foods. She drinks mostly coffee, water, wine with dinner. Exercise: Walking 30 minutes 3-4 times per week  Review of Systems      Past Medical History:  Diagnosis Date  . Arthritis    left hip  . GERD (gastroesophageal reflux disease)    occasional     Current Outpatient Medications  Medication Sig Dispense Refill  . calcium carbonate (TUMS - DOSED IN MG ELEMENTAL CALCIUM) 500 MG chewable tablet Chew 2 tablets by mouth as needed for indigestion or heartburn.    . cholecalciferol (VITAMIN D) 1000 units tablet Take 2,000 Units by mouth daily.     No current facility-administered medications for this visit.     Allergies  Allergen Reactions  . Penicillins Rash    Family History  Problem Relation Age of Onset  . Heart failure Mother   . Lung cancer Paternal Grandfather   . Cancer Sister        Glioblastoma  . Breast cancer Neg Hx   . Esophageal cancer Neg Hx   . Stomach cancer Neg Hx     Social History   Socioeconomic History  . Marital status: Married    Spouse name: Not on file  . Number of children: Not on file  . Years of education: Not on file  . Highest education level: Not on file  Occupational History  . Not on file  Social Needs  . Financial resource strain: Not on file  . Food insecurity:    Worry: Not on file    Inability: Not on file  . Transportation needs:    Medical: Not on file    Non-medical: Not on file  Tobacco Use  . Smoking status: Never Smoker  . Smokeless tobacco: Never Used  Substance and Sexual Activity  .  Alcohol use: Yes    Alcohol/week: 9.0 standard drinks    Types: 9 Glasses of wine per week  . Drug use: No  . Sexual activity: Yes    Birth control/protection: Post-menopausal  Lifestyle  . Physical activity:    Days per week: Not on file    Minutes per session: Not on file  . Stress: Not on file  Relationships  . Social connections:    Talks on phone: Not on file    Gets together: Not on file    Attends religious service: Not on file    Active member of club or organization: Not on file    Attends meetings of clubs or organizations: Not on file    Relationship status: Not on file  . Intimate partner violence:    Fear of current or ex partner: Not on file    Emotionally abused: Not on file    Physically abused: Not on file    Forced sexual activity: Not on file  Other Topics Concern  . Not on file  Social History Narrative  . Not on file     Constitutional: Denies fever, malaise, fatigue, headache or abrupt weight changes.  HEENT: Denies eye pain, eye redness, ear pain, ringing in the ears, wax buildup,  runny nose, nasal congestion, bloody nose, or sore throat. Respiratory: Denies difficulty breathing, shortness of breath, cough or sputum production.   Cardiovascular: Denies chest pain, chest tightness, palpitations or swelling in the hands or feet.  Gastrointestinal: Pt reports intermittent reflux. Denies abdominal pain, bloating, constipation, diarrhea or blood in the stool.  GU: Denies urgency, frequency, pain with urination, burning sensation, blood in urine, odor or discharge. Musculoskeletal: Pt reports intermittent left groin pain. Denies decrease in range of motion, difficulty with gait, muscle pain or joint swelling.  Skin: Denies redness, rashes, lesions or ulcercations.  Neurological: Denies dizziness, difficulty with memory, difficulty with speech or problems with balance and coordination.  Psych: Denies anxiety, depression, SI/HI.  No other specific complaints  in a complete review of systems (except as listed in HPI above).  Objective:   Physical Exam   BP 118/78   Pulse 75   Temp 98.1 F (36.7 C) (Oral)   Ht 5\' 5"  (1.651 m)   Wt 153 lb (69.4 kg)   SpO2 98%   BMI 25.46 kg/m  Wt Readings from Last 3 Encounters:  03/14/18 153 lb (69.4 kg)  06/14/17 145 lb (65.8 kg)  12/06/16 145 lb 8 oz (66 kg)    General: Appears her stated age, well developed, well nourished in NAD. Skin: Warm, dry and intact.  HEENT: Head: normal shape and size; Eyes: sclera white, no icterus, conjunctiva pink, PERRLA and EOMs intact; Ears: Tm's gray and intact, normal light reflex; Throat/Mouth: Teeth present, mucosa pink and moist, no exudate, lesions or ulcerations noted.  Neck:  Neck supple, trachea midline. No masses, lumps or thyromegaly present.  Cardiovascular: Normal rate and rhythm. S1,S2 noted.  No murmur, rubs or gallops noted. No JVD or BLE edema. No carotid bruits noted. Pulmonary/Chest: Normal effort and positive vesicular breath sounds. No respiratory distress. No wheezes, rales or ronchi noted.  Abdomen: Soft and nontender. Normal bowel sounds. No distention or masses noted. Liver, spleen and kidneys non palpable. Musculoskeletal: Strength 5/5 BUE/BLE. No difficulty with gait.  Neurological: Alert and oriented. Cranial nerves II-XII grossly intact. Coordination normal.  Psychiatric: Mood and affect normal. Behavior is normal. Judgment and thought content normal.    BMET    Component Value Date/Time   NA 141 12/06/2016 1243   K 4.0 12/06/2016 1243   CL 102 12/06/2016 1243   CO2 29 12/06/2016 1243   GLUCOSE 91 12/06/2016 1243   BUN 14 12/06/2016 1243   CREATININE 0.73 12/06/2016 1243   CALCIUM 9.5 12/06/2016 1243    Lipid Panel     Component Value Date/Time   CHOL 218 (H) 12/06/2016 1243   TRIG 68.0 12/06/2016 1243   HDL 73.90 12/06/2016 1243   CHOLHDL 3 12/06/2016 1243   VLDL 13.6 12/06/2016 1243   LDLCALC 131 (H) 12/06/2016 1243     CBC    Component Value Date/Time   WBC 4.5 12/06/2016 1243   RBC 4.13 12/06/2016 1243   HGB 13.2 12/06/2016 1243   HCT 40.2 12/06/2016 1243   PLT 214.0 12/06/2016 1243   MCV 97.3 12/06/2016 1243   MCHC 33.0 12/06/2016 1243   RDW 13.2 12/06/2016 1243    Hgb A1C No results found for: HGBA1C         Assessment & Plan:   Preventative Health Maintenance:  Flu shot UTD Tetanus UTD Pap smear UTD Mammogram UTD Colon screening UTD Bone density UTD Encouraged her to consume a balanced diet and exercise regimen  Advised her to see  an eye doctor and dentist annually Will check CBC, CMET, Lipid and Vit D today  RTC in 1 year, sooner if needed Webb Silversmith, NP

## 2018-03-14 NOTE — Patient Instructions (Signed)
Health Maintenance for Postmenopausal Women Menopause is a normal process in which your reproductive ability comes to an end. This process happens gradually over a span of months to years, usually between the ages of 22 and 9. Menopause is complete when you have missed 12 consecutive menstrual periods. It is important to talk with your health care provider about some of the most common conditions that affect postmenopausal women, such as heart disease, cancer, and bone loss (osteoporosis). Adopting a healthy lifestyle and getting preventive care can help to promote your health and wellness. Those actions can also lower your chances of developing some of these common conditions. What should I know about menopause? During menopause, you may experience a number of symptoms, such as:  Moderate-to-severe hot flashes.  Night sweats.  Decrease in sex drive.  Mood swings.  Headaches.  Tiredness.  Irritability.  Memory problems.  Insomnia.  Choosing to treat or not to treat menopausal changes is an individual decision that you make with your health care provider. What should I know about hormone replacement therapy and supplements? Hormone therapy products are effective for treating symptoms that are associated with menopause, such as hot flashes and night sweats. Hormone replacement carries certain risks, especially as you become older. If you are thinking about using estrogen or estrogen with progestin treatments, discuss the benefits and risks with your health care provider. What should I know about heart disease and stroke? Heart disease, heart attack, and stroke become more likely as you age. This may be due, in part, to the hormonal changes that your body experiences during menopause. These can affect how your body processes dietary fats, triglycerides, and cholesterol. Heart attack and stroke are both medical emergencies. There are many things that you can do to help prevent heart disease  and stroke:  Have your blood pressure checked at least every 1-2 years. High blood pressure causes heart disease and increases the risk of stroke.  If you are 53-22 years old, ask your health care provider if you should take aspirin to prevent a heart attack or a stroke.  Do not use any tobacco products, including cigarettes, chewing tobacco, or electronic cigarettes. If you need help quitting, ask your health care provider.  It is important to eat a healthy diet and maintain a healthy weight. ? Be sure to include plenty of vegetables, fruits, low-fat dairy products, and lean protein. ? Avoid eating foods that are high in solid fats, added sugars, or salt (sodium).  Get regular exercise. This is one of the most important things that you can do for your health. ? Try to exercise for at least 150 minutes each week. The type of exercise that you do should increase your heart rate and make you sweat. This is known as moderate-intensity exercise. ? Try to do strengthening exercises at least twice each week. Do these in addition to the moderate-intensity exercise.  Know your numbers.Ask your health care provider to check your cholesterol and your blood glucose. Continue to have your blood tested as directed by your health care provider.  What should I know about cancer screening? There are several types of cancer. Take the following steps to reduce your risk and to catch any cancer development as early as possible. Breast Cancer  Practice breast self-awareness. ? This means understanding how your breasts normally appear and feel. ? It also means doing regular breast self-exams. Let your health care provider know about any changes, no matter how small.  If you are 40  or older, have a clinician do a breast exam (clinical breast exam or CBE) every year. Depending on your age, family history, and medical history, it may be recommended that you also have a yearly breast X-ray (mammogram).  If you  have a family history of breast cancer, talk with your health care provider about genetic screening.  If you are at high risk for breast cancer, talk with your health care provider about having an MRI and a mammogram every year.  Breast cancer (BRCA) gene test is recommended for women who have family members with BRCA-related cancers. Results of the assessment will determine the need for genetic counseling and BRCA1 and for BRCA2 testing. BRCA-related cancers include these types: ? Breast. This occurs in males or females. ? Ovarian. ? Tubal. This may also be called fallopian tube cancer. ? Cancer of the abdominal or pelvic lining (peritoneal cancer). ? Prostate. ? Pancreatic.  Cervical, Uterine, and Ovarian Cancer Your health care provider may recommend that you be screened regularly for cancer of the pelvic organs. These include your ovaries, uterus, and vagina. This screening involves a pelvic exam, which includes checking for microscopic changes to the surface of your cervix (Pap test).  For women ages 21-65, health care providers may recommend a pelvic exam and a Pap test every three years. For women ages 79-65, they may recommend the Pap test and pelvic exam, combined with testing for human papilloma virus (HPV), every five years. Some types of HPV increase your risk of cervical cancer. Testing for HPV may also be done on women of any age who have unclear Pap test results.  Other health care providers may not recommend any screening for nonpregnant women who are considered low risk for pelvic cancer and have no symptoms. Ask your health care provider if a screening pelvic exam is right for you.  If you have had past treatment for cervical cancer or a condition that could lead to cancer, you need Pap tests and screening for cancer for at least 20 years after your treatment. If Pap tests have been discontinued for you, your risk factors (such as having a new sexual partner) need to be  reassessed to determine if you should start having screenings again. Some women have medical problems that increase the chance of getting cervical cancer. In these cases, your health care provider may recommend that you have screening and Pap tests more often.  If you have a family history of uterine cancer or ovarian cancer, talk with your health care provider about genetic screening.  If you have vaginal bleeding after reaching menopause, tell your health care provider.  There are currently no reliable tests available to screen for ovarian cancer.  Lung Cancer Lung cancer screening is recommended for adults 69-62 years old who are at high risk for lung cancer because of a history of smoking. A yearly low-dose CT scan of the lungs is recommended if you:  Currently smoke.  Have a history of at least 30 pack-years of smoking and you currently smoke or have quit within the past 15 years. A pack-year is smoking an average of one pack of cigarettes per day for one year.  Yearly screening should:  Continue until it has been 15 years since you quit.  Stop if you develop a health problem that would prevent you from having lung cancer treatment.  Colorectal Cancer  This type of cancer can be detected and can often be prevented.  Routine colorectal cancer screening usually begins at  age 42 and continues through age 45.  If you have risk factors for colon cancer, your health care provider may recommend that you be screened at an earlier age.  If you have a family history of colorectal cancer, talk with your health care provider about genetic screening.  Your health care provider may also recommend using home test kits to check for hidden blood in your stool.  A small camera at the end of a tube can be used to examine your colon directly (sigmoidoscopy or colonoscopy). This is done to check for the earliest forms of colorectal cancer.  Direct examination of the colon should be repeated every  5-10 years until age 71. However, if early forms of precancerous polyps or small growths are found or if you have a family history or genetic risk for colorectal cancer, you may need to be screened more often.  Skin Cancer  Check your skin from head to toe regularly.  Monitor any moles. Be sure to tell your health care provider: ? About any new moles or changes in moles, especially if there is a change in a mole's shape or color. ? If you have a mole that is larger than the size of a pencil eraser.  If any of your family members has a history of skin cancer, especially at a young age, talk with your health care provider about genetic screening.  Always use sunscreen. Apply sunscreen liberally and repeatedly throughout the day.  Whenever you are outside, protect yourself by wearing long sleeves, pants, a wide-brimmed hat, and sunglasses.  What should I know about osteoporosis? Osteoporosis is a condition in which bone destruction happens more quickly than new bone creation. After menopause, you may be at an increased risk for osteoporosis. To help prevent osteoporosis or the bone fractures that can happen because of osteoporosis, the following is recommended:  If you are 46-71 years old, get at least 1,000 mg of calcium and at least 600 mg of vitamin D per day.  If you are older than age 55 but younger than age 65, get at least 1,200 mg of calcium and at least 600 mg of vitamin D per day.  If you are older than age 54, get at least 1,200 mg of calcium and at least 800 mg of vitamin D per day.  Smoking and excessive alcohol intake increase the risk of osteoporosis. Eat foods that are rich in calcium and vitamin D, and do weight-bearing exercises several times each week as directed by your health care provider. What should I know about how menopause affects my mental health? Depression may occur at any age, but it is more common as you become older. Common symptoms of depression  include:  Low or sad mood.  Changes in sleep patterns.  Changes in appetite or eating patterns.  Feeling an overall lack of motivation or enjoyment of activities that you previously enjoyed.  Frequent crying spells.  Talk with your health care provider if you think that you are experiencing depression. What should I know about immunizations? It is important that you get and maintain your immunizations. These include:  Tetanus, diphtheria, and pertussis (Tdap) booster vaccine.  Influenza every year before the flu season begins.  Pneumonia vaccine.  Shingles vaccine.  Your health care provider may also recommend other immunizations. This information is not intended to replace advice given to you by your health care provider. Make sure you discuss any questions you have with your health care provider. Document Released: 05/07/2005  Document Revised: 10/03/2015 Document Reviewed: 12/17/2014 [employer] Interactive Patient Education  2018 [employer]  

## 2018-05-28 IMAGING — DX DG HIP (WITH OR WITHOUT PELVIS) 2-3V*L*
3 series · 3 of 3 positions shown · non-contrast
Comparison: None.

CLINICAL DATA: Left hip pain for 2 months

EXAM:
DG HIP (WITH OR WITHOUT PELVIS) 2-3V LEFT

[pelvis ap]
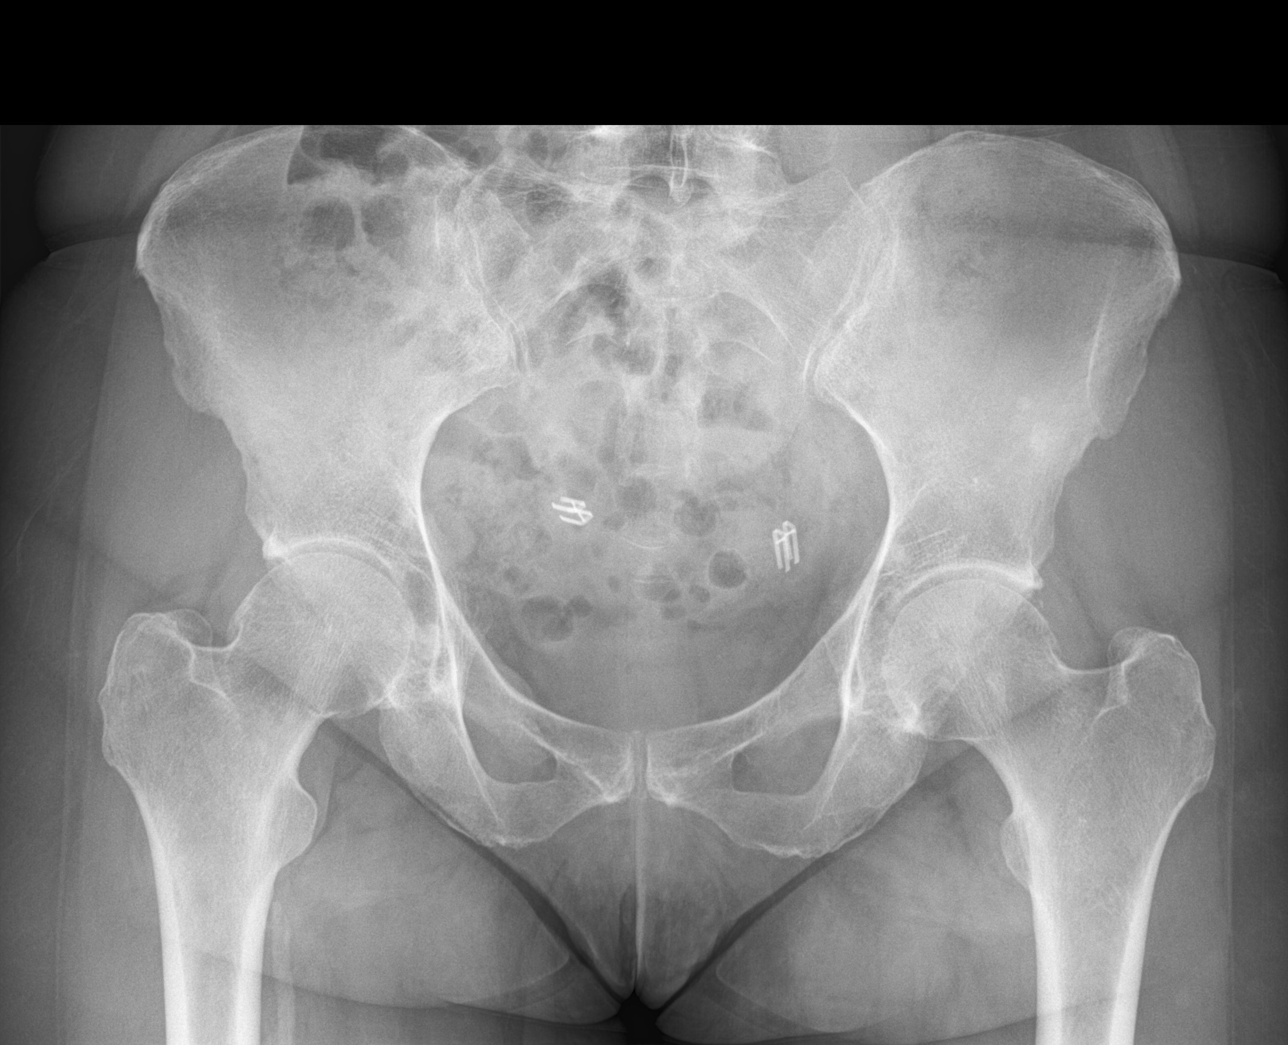

[hip ap]
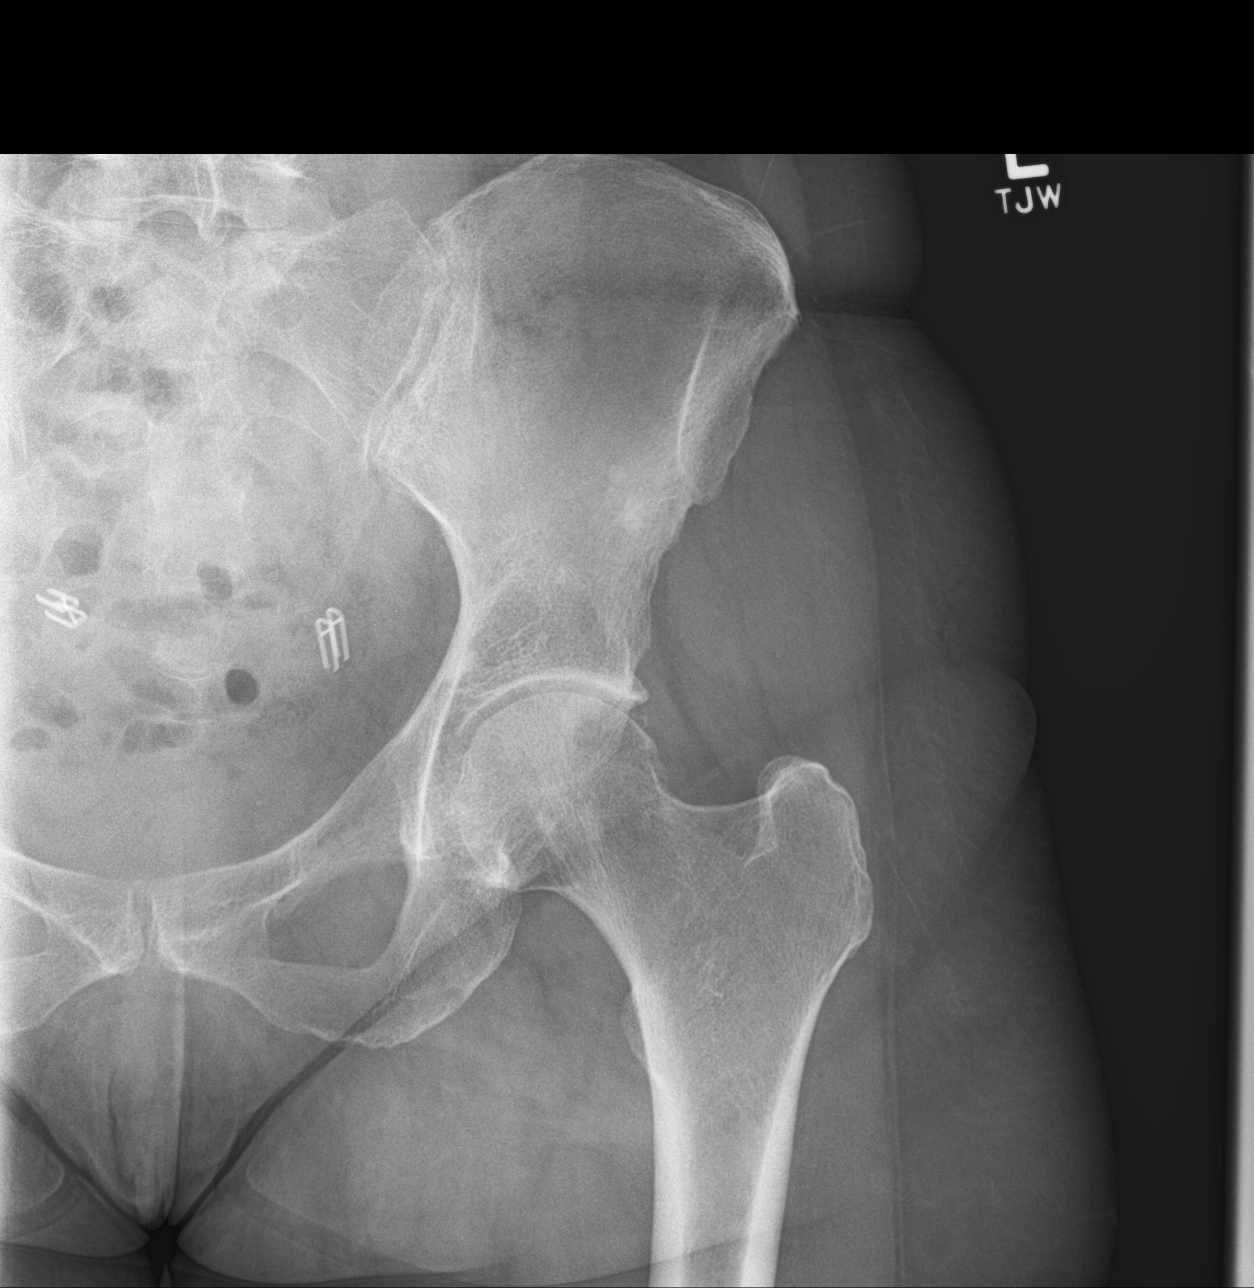

[hip lat]
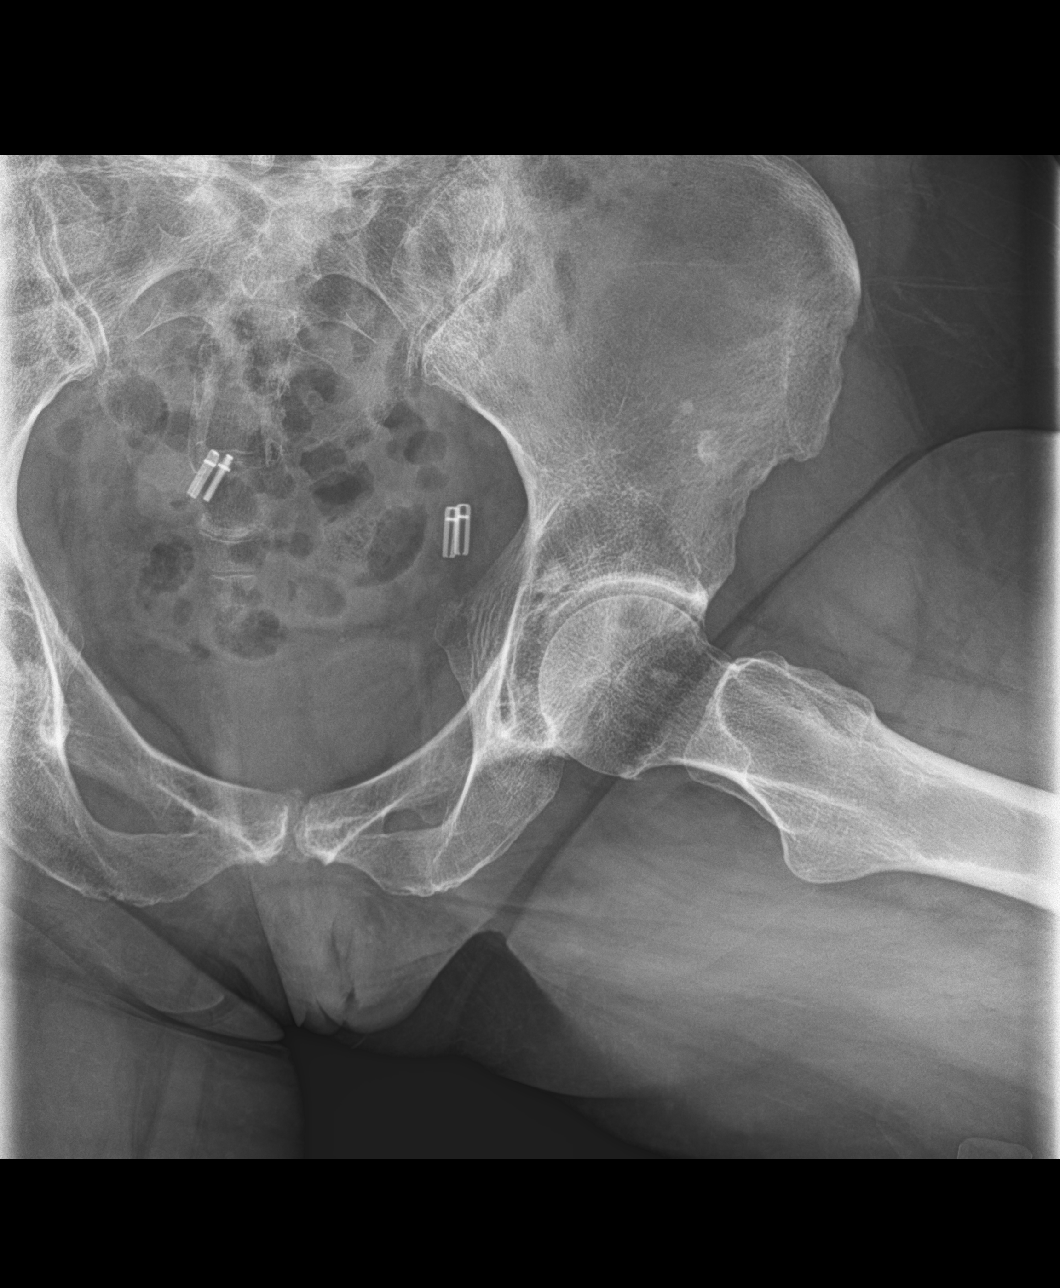

[3 of 3 positions shown; findings below may reference images not displayed]

FINDINGS: Weightbearing frontal pelvis as well as weightbearing frontal and
supine lateral left hip images were obtained. There is no fracture
or dislocation. There is slight symmetric narrowing of both hip
joints. There is a subchondral cystic area in the lateral left
femoral head. No erosive change. There are tubal ligation clips in
the pelvis bilaterally.
IMPRESSION: Slight symmetric narrowing of both hip joints. Subchondral cyst
lateral left femoral head. No fracture or dislocation.

## 2018-12-12 ENCOUNTER — Ambulatory Visit: Payer: 59

## 2018-12-15 ENCOUNTER — Ambulatory Visit (INDEPENDENT_AMBULATORY_CARE_PROVIDER_SITE_OTHER): Payer: 59

## 2018-12-15 DIAGNOSIS — Z23 Encounter for immunization: Secondary | ICD-10-CM | POA: Diagnosis not present

## 2019-01-01 ENCOUNTER — Other Ambulatory Visit: Payer: Self-pay

## 2019-01-01 DIAGNOSIS — Z20822 Contact with and (suspected) exposure to covid-19: Secondary | ICD-10-CM

## 2019-01-02 LAB — NOVEL CORONAVIRUS, NAA: SARS-CoV-2, NAA: NOT DETECTED

## 2019-01-03 ENCOUNTER — Other Ambulatory Visit: Payer: Self-pay

## 2019-01-03 DIAGNOSIS — Z20822 Contact with and (suspected) exposure to covid-19: Secondary | ICD-10-CM

## 2019-01-05 LAB — NOVEL CORONAVIRUS, NAA: SARS-CoV-2, NAA: NOT DETECTED

## 2019-01-05 LAB — SPECIMEN STATUS REPORT

## 2019-03-19 ENCOUNTER — Ambulatory Visit
Admission: RE | Admit: 2019-03-19 | Discharge: 2019-03-19 | Disposition: A | Discharge status: Home / Self Care | Payer: 59 | Source: Ambulatory Visit | Attending: Internal Medicine | Admitting: Internal Medicine

## 2019-03-19 ENCOUNTER — Other Ambulatory Visit: Payer: Self-pay | Admitting: Internal Medicine

## 2019-03-19 DIAGNOSIS — Z1231 Encounter for screening mammogram for malignant neoplasm of breast: Secondary | ICD-10-CM | POA: Insufficient documentation

## 2019-05-14 ENCOUNTER — Other Ambulatory Visit: Payer: Self-pay

## 2019-05-15 ENCOUNTER — Ambulatory Visit: Payer: Self-pay | Attending: Internal Medicine

## 2019-05-15 DIAGNOSIS — Z20822 Contact with and (suspected) exposure to covid-19: Secondary | ICD-10-CM | POA: Insufficient documentation

## 2019-05-16 LAB — NOVEL CORONAVIRUS, NAA: SARS-CoV-2, NAA: NOT DETECTED

## 2019-06-15 ENCOUNTER — Ambulatory Visit: Payer: Self-pay | Attending: Internal Medicine

## 2019-06-15 DIAGNOSIS — Z20822 Contact with and (suspected) exposure to covid-19: Secondary | ICD-10-CM

## 2019-06-16 LAB — NOVEL CORONAVIRUS, NAA: SARS-CoV-2, NAA: NOT DETECTED

## 2019-09-20 ENCOUNTER — Encounter: Payer: Self-pay | Admitting: Internal Medicine

## 2019-10-12 ENCOUNTER — Telehealth: Payer: Self-pay | Admitting: *Deleted

## 2019-10-12 ENCOUNTER — Ambulatory Visit
Admission: EM | Admit: 2019-10-12 | Discharge: 2019-10-12 | Disposition: A | Discharge status: Home / Self Care | Payer: Self-pay | Attending: Family Medicine | Admitting: Family Medicine

## 2019-10-12 DIAGNOSIS — R0981 Nasal congestion: Secondary | ICD-10-CM

## 2019-10-12 DIAGNOSIS — R059 Cough, unspecified: Secondary | ICD-10-CM

## 2019-10-12 DIAGNOSIS — B349 Viral infection, unspecified: Secondary | ICD-10-CM

## 2019-10-12 DIAGNOSIS — J020 Streptococcal pharyngitis: Secondary | ICD-10-CM

## 2019-10-12 DIAGNOSIS — R05 Cough: Secondary | ICD-10-CM

## 2019-10-12 MED ORDER — BENZONATATE 100 MG PO CAPS: 100.0000 mg | ORAL_CAPSULE | Freq: Three times a day (TID) | ORAL | 0 refills | Status: DC

## 2019-10-12 NOTE — ED Triage Notes (Signed)
Patient c/o sore throat and productive cough x4 days. Reports that sore throat gets worse at night. Also endorses some nasal congestion.  Denies: loss of appetite, ear pain, ShOB, chest pain  OTC: advil, nyquil pm

## 2019-10-12 NOTE — ED Provider Notes (Signed)
[place]   [id number] 10/12/19 Arrival Time: 1062   CC: COVID symptoms  SUBJECTIVE: History from: patient.  Shelby Griffith is a 64 y.o. female who presents with abrupt onset of nasal congestion, PND, and persistent dry cough for the last 4 days. Denies sick exposure to COVID, flu or strep.  Has had both Covid vaccines.  Denies recent travel. Has not taken OTC medications for this. There are no aggravating or alleviating symptoms. Denies fever, chills, fatigue, sinus pain, rhinorrhea, SOB, wheezing, chest pain, nausea, changes in bowel or bladder habits.    ROS: As per HPI.  All other pertinent ROS negative.     Past Medical History:  Diagnosis Date  . Arthritis    left hip  . GERD (gastroesophageal reflux disease)    occasional    Past Surgical History:  Procedure Laterality Date  . CESAREAN SECTION     x 3  . COLONOSCOPY WITH PROPOFOL N/A 06/14/2017   Procedure: COLONOSCOPY WITH PROPOFOL;  Surgeon: Lin Landsman, MD;  Location: [facility] ENDOSCOPY;  Service: Gastroenterology;  Laterality: N/A;  . ENDOMETRIAL ABLATION    . THYROID LOBECTOMY Right 1986  . thyroid lobectomy Right   . TONSILLECTOMY  1977  . VARICOSE VEIN SURGERY Bilateral    Allergies  Allergen Reactions  . Penicillins Rash   No current facility-administered medications on file prior to encounter.   Current Outpatient Medications on File Prior to Encounter  Medication Sig Dispense Refill  . calcium carbonate (TUMS - DOSED IN MG ELEMENTAL CALCIUM) 500 MG chewable tablet Chew 2 tablets by mouth as needed for indigestion or heartburn.     Social History   Socioeconomic History  . Marital status: Married    Spouse name: Not on file  . Number of children: Not on file  . Years of education: Not on file  . Highest education level: Not on file  Occupational History  . Not on file  Tobacco Use  . Smoking status: Never Smoker  . Smokeless tobacco: Never Used  Vaping Use  . Vaping Use:  Never used  Substance and Sexual Activity  . Alcohol use: Yes    Alcohol/week: 9.0 standard drinks    Types: 9 Glasses of wine per week  . Drug use: No  . Sexual activity: Yes    Birth control/protection: Post-menopausal  Other Topics Concern  . Not on file  Social History Narrative  . Not on file   Social Determinants of Health   Financial Resource Strain:   . Difficulty of Paying Living Expenses:   Food Insecurity:   . Worried About Charity fundraiser in the Last Year:   . Arboriculturist in the Last Year:   Transportation Needs:   . Film/video editor (Medical):   [name] Lack of Transportation (Non-Medical):   Physical Activity:   . Days of Exercise per Week:   . Minutes of Exercise per Session:   Stress:   . Feeling of Stress :   Social Connections:   . Frequency of Communication with Friends and Family:   . Frequency of Social Gatherings with Friends and Family:   . Attends Religious Services:   . Active Member of Clubs or Organizations:   . Attends Archivist Meetings:   [name] Marital Status:   Intimate Partner Violence:   . Fear of Current or Ex-Partner:   . Emotionally Abused:   [name] Physically Abused:   . Sexually Abused:    Family History  Problem Relation Age of Onset  . Heart failure Mother   . Lung cancer Paternal Grandfather   . Cancer Sister        Glioblastoma  . Breast cancer Neg Hx   . Esophageal cancer Neg Hx   . Stomach cancer Neg Hx     OBJECTIVE:  Vitals:   10/12/19 1253  BP: 131/82  Pulse: 86  Resp: 16  Temp: 98.7 F (37.1 C)  SpO2: 97%     General appearance: alert; appears fatigued, but nontoxic; speaking in full sentences and tolerating own secretions HEENT: NCAT; Ears: EACs clear, TMs pearly gray; Eyes: PERRL.  EOM grossly intact. Sinuses: nontender; Nose: nares patent without rhinorrhea, Throat: oropharynx clear, tonsils non erythematous or enlarged, uvula midline  Neck: supple without LAD Lungs: unlabored respirations,  symmetrical air entry; cough: mild; no respiratory distress; CTAB Heart: regular rate and rhythm.  Radial pulses 2+ symmetrical bilaterally Skin: warm and dry Psychological: alert and cooperative; normal mood and affect  LABS:  No results found for this or any previous visit (from the past 24 hour(s)).   ASSESSMENT & PLAN:  1. Nasal congestion   2. Cough   3. Viral illness     Meds ordered this encounter  Medications  . benzonatate (TESSALON) 100 MG capsule    Sig: Take 1 capsule (100 mg total) by mouth every 8 (eight) hours.    Dispense:  21 capsule    Refill:  0    Order Specific Question:   Supervising Provider    Answer:   Chase Picket [[id number]]   Cough  Congestion Likely viral in origin Prescribed Tessalon Perles May use Mucinex May use Chloraseptic spray for sore throat as needed COVID testing ordered.  It will take between 1-2 days for test results.  Someone will contact you regarding abnormal results.    Patient should remain in quarantine until they have received Covid results.  If negative you may resume normal activities (go back to work/school) while practicing hand hygiene, social distance, and mask wearing.  If positive, patient should remain in quarantine for 10 days from symptom onset AND greater than 72 hours after symptoms resolution (absence of fever without the use of fever-reducing medication and improvement in respiratory symptoms), whichever is longer Get plenty of rest and push fluids Use OTC zyrtec for nasal congestion, runny nose, and/or sore throat Use OTC flonase for nasal congestion and runny nose Use medications daily for symptom relief Use OTC medications like ibuprofen or tylenol as needed fever or pain Call or go to the ED if you have any new or worsening symptoms such as fever, worsening cough, shortness of breath, chest tightness, chest pain, turning blue, changes in mental status.  Reviewed expectations re: course of current medical  issues. Questions answered. Outlined signs and symptoms indicating need for more acute intervention. Patient verbalized understanding. After Visit Summary given.         Faustino Congress, NP 10/12/19 1337

## 2019-10-12 NOTE — Discharge Instructions (Addendum)
I have sent in [place] for you to use for the cough  May use Mucinex for congestion  May continue ibuprofen/tylenol for throat pain  Your COVID test is pending.  You should self quarantine until the test result is back.    Take Tylenol as needed for fever or discomfort.  Rest and keep yourself hydrated.    Go to the emergency department if you develop shortness of breath, severe diarrhea, high fever not relieved by Tylenol or ibuprofen, or other concerning symptoms.

## 2019-10-12 NOTE — Telephone Encounter (Signed)
Agree with advice given

## 2019-10-12 NOTE — Telephone Encounter (Signed)
Patient called stating that she would like an antibiotic called in because she has a cough and bad sore throat. Patient stated that these symptoms started about 4 days ago. Patient denies fever, SOB or difficulty breathing. Patient stated that she does have a headache and has vomited. Patient was advised that an antibiotic can not be called in. Patient was advised unfortunately with her symptoms we can not bring her into the office and we do not have any virtual appointments available today. Patient was advised with her symptoms and that she feels  she needs medication she should go to an urgent care for evaluation. Patient stated that she will go to the Urgent Care in [place] to be seen.

## 2019-10-13 LAB — SARS-COV-2, NAA 2 DAY TAT

## 2019-10-13 LAB — NOVEL CORONAVIRUS, NAA: SARS-CoV-2, NAA: NOT DETECTED

## 2019-10-30 DIAGNOSIS — L821 Other seborrheic keratosis: Secondary | ICD-10-CM | POA: Diagnosis not present

## 2019-10-30 DIAGNOSIS — D1801 Hemangioma of skin and subcutaneous tissue: Secondary | ICD-10-CM | POA: Diagnosis not present

## 2019-10-30 DIAGNOSIS — L814 Other melanin hyperpigmentation: Secondary | ICD-10-CM | POA: Diagnosis not present

## 2019-10-30 DIAGNOSIS — D225 Melanocytic nevi of trunk: Secondary | ICD-10-CM | POA: Diagnosis not present

## 2019-10-30 DIAGNOSIS — B078 Other viral warts: Secondary | ICD-10-CM | POA: Diagnosis not present

## 2019-11-07 ENCOUNTER — Other Ambulatory Visit: Payer: Self-pay

## 2019-11-07 ENCOUNTER — Encounter: Payer: Self-pay | Admitting: Internal Medicine

## 2019-11-07 ENCOUNTER — Ambulatory Visit (INDEPENDENT_AMBULATORY_CARE_PROVIDER_SITE_OTHER): Payer: Medicare HMO | Admitting: Internal Medicine

## 2019-11-07 VITALS — BP 122/78 | HR 75 | Temp 97.8°F | Ht 65.0 in | Wt 154.0 lb

## 2019-11-07 DIAGNOSIS — K219 Gastro-esophageal reflux disease without esophagitis: Secondary | ICD-10-CM | POA: Diagnosis not present

## 2019-11-07 DIAGNOSIS — Z0000 Encounter for general adult medical examination without abnormal findings: Secondary | ICD-10-CM | POA: Diagnosis not present

## 2019-11-07 DIAGNOSIS — M1612 Unilateral primary osteoarthritis, left hip: Secondary | ICD-10-CM | POA: Diagnosis not present

## 2019-11-07 DIAGNOSIS — M169 Osteoarthritis of hip, unspecified: Secondary | ICD-10-CM | POA: Insufficient documentation

## 2019-11-07 NOTE — Progress Notes (Signed)
HPI:  Patient presents to the clinic today for her welcome to Medicare exam.  Arthritis: Mainly in her left hip. She does not take anything for this.  GERD: Intermittent, she thinks it is triggered by certain wines, maybe some tomato based. She takes Famotidine as needed with good relief of symptoms. [place] from 05/2010 reviewed.  Past Medical History:  Diagnosis Date  . Arthritis    left hip  . GERD (gastroesophageal reflux disease)    occasional     Current Outpatient Medications  Medication Sig Dispense Refill  . benzonatate (TESSALON) 100 MG capsule Take 1 capsule (100 mg total) by mouth every 8 (eight) hours. 21 capsule 0  . calcium carbonate (TUMS - DOSED IN MG ELEMENTAL CALCIUM) 500 MG chewable tablet Chew 2 tablets by mouth as needed for indigestion or heartburn.     No current facility-administered medications for this visit.    Allergies  Allergen Reactions  . Penicillins Rash    Family History  Problem Relation Age of Onset  . Heart failure Mother   . Lung cancer Paternal Grandfather   . Cancer Sister        Glioblastoma  . Breast cancer Neg Hx   . Esophageal cancer Neg Hx   . Stomach cancer Neg Hx     Social History   Socioeconomic History  . Marital status: Married    Spouse name: Not on file  . Number of children: Not on file  . Years of education: Not on file  . Highest education level: Not on file  Occupational History  . Not on file  Tobacco Use  . Smoking status: Never Smoker  . Smokeless tobacco: Never Used  Vaping Use  . Vaping Use: Never used  Substance and Sexual Activity  . Alcohol use: Yes    Alcohol/week: 9.0 standard drinks    Types: 9 Glasses of wine per week  . Drug use: No  . Sexual activity: Yes    Birth control/protection: Post-menopausal  Other Topics Concern  . Not on file  Social History Narrative  . Not on file   Social Determinants of Health   Financial Resource Strain:   . Difficulty of Paying Living Expenses:    Food Insecurity:   . Worried About Charity fundraiser in the Last Year:   . Arboriculturist in the Last Year:   Transportation Needs:   . Film/video editor (Medical):   [name] Lack of Transportation (Non-Medical):   Physical Activity:   . Days of Exercise per Week:   . Minutes of Exercise per Session:   Stress:   . Feeling of Stress :   Social Connections:   . Frequency of Communication with Friends and Family:   . Frequency of Social Gatherings with Friends and Family:   . Attends Religious Services:   . Active Member of Clubs or Organizations:   . Attends Archivist Meetings:   [name] Marital Status:   Intimate Partner Violence:   . Fear of Current or Ex-Partner:   . Emotionally Abused:   [name] Physically Abused:   . Sexually Abused:     Hospitiliaztions: None  Health Maintenance:    Flu: 11/2018  Tetanus: 02/2009  Pneumovax: Never  Prevnar: Never  Shingrix: Never  Covid Pfizer  Mammogram: 02/2019  Pap Smear: 06/2017  Bone Density: 11/2015  Colon Screening: 05/2017, every 3 years  Eye Doctor: as needed  Dental Exam: yearly   Providers:   PCP: Rollene Fare  Garnette Gunner, NP   Gastroenterology: Dr. Marius Ditch     I have personally reviewed and have noted:  1. The patient's medical and social history 2. Their use of alcohol, tobacco or illicit drugs 3. Their current medications and supplements 4. The patient's functional ability including ADL's, fall risks, home safety risks and hearing or visual impairment. 5. Diet and physical activities 6. Evidence for depression or mood disorder  Subjective:   Review of Systems:   Constitutional: Denies fever, malaise, fatigue, headache or abrupt weight changes.  HEENT: Denies eye pain, eye redness, ear pain, ringing in the ears, wax buildup, runny nose, nasal congestion, bloody nose, or sore throat. Respiratory: Denies difficulty breathing, shortness of breath, cough or sputum production.   Cardiovascular: Denies chest pain, chest  tightness, palpitations or swelling in the hands or feet.  Gastrointestinal: Pt reports intermittent reflux, bowel changes. Denies abdominal pain, bloating, constipation, diarrhea or blood in the stool.  GU: Denies urgency, frequency, pain with urination, burning sensation, blood in urine, odor or discharge. Musculoskeletal: Pt reports intermittent left hip pain, left groin pain. Denies decrease in range of motion, difficulty with gait, muscle pain or joint swelling.  Skin: Denies redness, rashes, lesions or ulcercations.  Neurological: Denies dizziness, difficulty with memory, difficulty with speech or problems with balance and coordination.  Psych: Denies anxiety, depression, SI/HI.  No other specific complaints in a complete review of systems (except as listed in HPI above).  Objective:  PE:   BP 122/78   Pulse 75   Temp 97.8 F (36.6 C) (Temporal)   Ht _0  (1.651 m)   Wt 154 lb (69.9 kg)   SpO2 98%   BMI 25.63 kg/m   Wt Readings from Last 3 Encounters:  03/14/18 153 lb (69.4 kg)  06/14/17 145 lb (65.8 kg)  12/06/16 145 lb 8 oz (66 kg)    General: Appears her stated age, well developed, well nourished in NAD. Skin: Warm, dry and intact. No rashesnoted. HEENT: Head: normal shape and size; Eyes: sclera white, no icterus, conjunctiva pink, PERRLA and EOMs intact;  Neck: Neck supple, trachea midline. No masses, lumps or thyromegaly present.  Cardiovascular: Normal rate and rhythm. S1,S2 noted.  No murmur, rubs or gallops noted. No JVD or BLE edema. No carotid bruits noted. Varicose veins noted of left groin. Pulmonary/Chest: Normal effort and positive vesicular breath sounds. No respiratory distress. No wheezes, rales or ronchi noted.  Abdomen: Soft and nontender. Normal bowel sounds. No distention or masses noted. Liver, spleen and kidneys non palpable. Musculoskeletal:  Strength 5/5 BUE/BLE. No signs of joint swelling.  Neurological: Alert and oriented. Cranial nerves II-XII  grossly intact. Coordination normal.  Psychiatric: Mood and affect normal. Behavior is normal. Judgment and thought content normal.    BMET    Component Value Date/Time   NA 140 03/14/2018 1436   K 4.5 03/14/2018 1436   CL 104 03/14/2018 1436   CO2 29 03/14/2018 1436   GLUCOSE 99 03/14/2018 1436   BUN 15 03/14/2018 1436   CREATININE 0.70 03/14/2018 1436   CALCIUM 9.6 03/14/2018 1436    Lipid Panel     Component Value Date/Time   CHOL 231 (H) 03/14/2018 1436   TRIG 45.0 03/14/2018 1436   HDL 74.90 03/14/2018 1436   CHOLHDL 3 03/14/2018 1436   VLDL 9.0 03/14/2018 1436   LDLCALC 147 (H) 03/14/2018 1436    CBC    Component Value Date/Time   WBC 5.7 03/14/2018 1436   RBC 4.10 03/14/2018  1436   HGB 13.1 03/14/2018 1436   HCT 39.1 03/14/2018 1436   PLT 222.0 03/14/2018 1436   MCV 95.4 03/14/2018 1436   MCHC 33.5 03/14/2018 1436   RDW 12.7 03/14/2018 1436    Hgb A1C No results found for: HGBA1C    Assessment and Plan:   Medicare Annual Wellness Visit:  Diet: She does eat meat. She consumes fruits and veggies daily. She tries to avoid fried foods. She drinks mostly water. Physical activity: Walking Depression/mood screen: Negative, PHQ nine score of 0 Hearing: Intact to whispered voice Visual acuity: Grossly normal, performs annual eye exam  ADLs: Capable Fall risk: None Home safety: Good Cognitive evaluation: Intact to orientation, naming, recall and repetition EOL planning: Adv directives, full code/ I agree  Preventative Medicine: Encouraged her to get a flu shot in the fall. Advised her Medicare would not pay for a tetanus injection as prophylaxis but if she gets bitten or cut she will need to go get a tetanus injection. She declines pneumonia vaccine at this time. Encouraged her to call her insurance company about Shingrix vaccine and get this at the pharmacy if covered. Mammogram due in December, she will schedule this. Pap smear due 2024. Bone density due  2022. Colon screening due, she will call GI to schedule Encouraged her to consume a balanced diet exercise regimen. Advised her to see an eye doctor and dentist annually. Will check CBC, C met, lipid and vitamin D, Free T4 today.   Next appointment: 1 year, Medicare Wellness Exam   Webb Silversmith, NP This visit occurred during the SARS-CoV-2 public health emergency.  Safety protocols were in place, including screening questions prior to the visit, additional usage of staff PPE, and extensive cleaning of exam room while observing appropriate contact time as indicated for disinfecting solutions.

## 2019-11-07 NOTE — Assessment & Plan Note (Signed)
Continue Famotidine as needed 

## 2019-11-07 NOTE — Patient Instructions (Signed)

## 2019-11-07 NOTE — Assessment & Plan Note (Signed)
Can take NSAIDs OTC if needed

## 2019-11-08 LAB — CBC
HCT: 39.6 % (ref 36.0–46.0)
Hemoglobin: 13.3 g/dL (ref 12.0–15.0)
MCHC: 33.7 g/dL (ref 30.0–36.0)
MCV: 95.8 fl (ref 78.0–100.0)
Platelets: 210 10*3/uL (ref 150.0–400.0)
RBC: 4.13 Mil/uL (ref 3.87–5.11)
RDW: 13.1 % (ref 11.5–15.5)
WBC: 5.2 10*3/uL (ref 4.0–10.5)

## 2019-11-08 LAB — COMPREHENSIVE METABOLIC PANEL
ALT: 12 U/L (ref 0–35)
AST: 21 U/L (ref 0–37)
Albumin: 4.7 g/dL (ref 3.5–5.2)
Alkaline Phosphatase: 68 U/L (ref 39–117)
BUN: 10 mg/dL (ref 6–23)
CO2: 29 mEq/L (ref 19–32)
Calcium: 9.9 mg/dL (ref 8.4–10.5)
Chloride: 102 mEq/L (ref 96–112)
Creatinine, Ser: 0.72 mg/dL (ref 0.40–1.20)
GFR: 81.29 mL/min (ref >60.00)
Glucose, Bld: 98 mg/dL (ref 70–99)
Potassium: 4.3 mEq/L (ref 3.5–5.1)
Sodium: 139 mEq/L (ref 135–145)
Total Bilirubin: 0.7 mg/dL (ref 0.2–1.2)
Total Protein: 7.1 g/dL (ref 6.0–8.3)

## 2019-11-08 LAB — LIPID PANEL
Cholesterol: 248 mg/dL — ABNORMAL HIGH (ref 0–200)
HDL: 79.1 mg/dL (ref >39.00)
LDL Cholesterol: 156 mg/dL — ABNORMAL HIGH (ref 0–99)
NonHDL: 169.32
Total CHOL/HDL Ratio: 3
Triglycerides: 65 mg/dL (ref 0.0–149.0)
VLDL: 13 mg/dL (ref 0.0–40.0)

## 2019-11-08 LAB — VITAMIN D 25 HYDROXY (VIT D DEFICIENCY, FRACTURES): VITD: 50.12 ng/mL (ref 30.00–100.00)

## 2019-11-08 LAB — T4, FREE: Free T4: 0.89 ng/dL (ref 0.60–1.60)

## 2019-12-12 DIAGNOSIS — Z20822 Contact with and (suspected) exposure to covid-19: Secondary | ICD-10-CM | POA: Diagnosis not present

## 2020-01-23 ENCOUNTER — Ambulatory Visit (INDEPENDENT_AMBULATORY_CARE_PROVIDER_SITE_OTHER): Payer: Medicare HMO

## 2020-01-23 ENCOUNTER — Other Ambulatory Visit: Payer: Self-pay

## 2020-01-23 DIAGNOSIS — Z23 Encounter for immunization: Secondary | ICD-10-CM | POA: Diagnosis not present

## 2020-01-24 ENCOUNTER — Encounter: Payer: Self-pay | Admitting: Internal Medicine

## 2020-01-24 ENCOUNTER — Ambulatory Visit (INDEPENDENT_AMBULATORY_CARE_PROVIDER_SITE_OTHER)
Admission: RE | Admit: 2020-01-24 | Discharge: 2020-01-24 | Disposition: A | Discharge status: Home / Self Care | Payer: Medicare HMO | Source: Ambulatory Visit | Attending: Internal Medicine | Admitting: Internal Medicine

## 2020-01-24 ENCOUNTER — Other Ambulatory Visit: Payer: Self-pay

## 2020-01-24 ENCOUNTER — Ambulatory Visit (INDEPENDENT_AMBULATORY_CARE_PROVIDER_SITE_OTHER): Payer: Medicare HMO | Admitting: Internal Medicine

## 2020-01-24 VITALS — BP 114/74 | HR 74 | Temp 97.0°F | Wt 155.0 lb

## 2020-01-24 DIAGNOSIS — E079 Disorder of thyroid, unspecified: Secondary | ICD-10-CM | POA: Diagnosis not present

## 2020-01-24 DIAGNOSIS — T17208A Unspecified foreign body in pharynx causing other injury, initial encounter: Secondary | ICD-10-CM

## 2020-01-24 DIAGNOSIS — R0989 Other specified symptoms and signs involving the circulatory and respiratory systems: Secondary | ICD-10-CM | POA: Diagnosis not present

## 2020-01-24 DIAGNOSIS — I6521 Occlusion and stenosis of right carotid artery: Secondary | ICD-10-CM

## 2020-01-24 DIAGNOSIS — E0789 Other specified disorders of thyroid: Secondary | ICD-10-CM | POA: Diagnosis not present

## 2020-01-24 NOTE — Progress Notes (Signed)
Subjective:    Patient ID: Shelby Griffith, female    DOB: [birth year], 64 y.o.   MRN: [id number]  HPI  Pt presents to the clinic today with c/o that something is stuck in her throat. She notice this 2 week ago. She reports she ate some [place] prior to feeling this sensation and is not sure if it is possibly a bone stuck in her throat. She denies post nasal drip, sore throat, difficulty swallowing, cough, SOB or chest pain. She has a history of intermittent reflux but reports this feels different. She has not taken anything OTC for this.  Review of Systems      Past Medical History:  Diagnosis Date  . Arthritis    left hip  . GERD (gastroesophageal reflux disease)    occasional     Current Outpatient Medications  Medication Sig Dispense Refill  . Cholecalciferol (VITAMIN D) 10 MCG/ML LIQD Take 1 drop by mouth daily.    . famotidine (PEPCID) 10 MG tablet Take 10 mg by mouth 2 (two) times daily.     No current facility-administered medications for this visit.    Allergies  Allergen Reactions  . Penicillins Rash    Family History  Problem Relation Age of Onset  . Heart failure Mother   . Lung cancer Paternal Grandfather   . Cancer Sister        Glioblastoma  . Breast cancer Neg Hx   . Esophageal cancer Neg Hx   . Stomach cancer Neg Hx     Social History   Socioeconomic History  . Marital status: Married    Spouse name: Not on file  . Number of children: Not on file  . Years of education: Not on file  . Highest education level: Not on file  Occupational History  . Not on file  Tobacco Use  . Smoking status: Never Smoker  . Smokeless tobacco: Never Used  Vaping Use  . Vaping Use: Never used  Substance and Sexual Activity  . Alcohol use: Yes    Alcohol/week: 9.0 standard drinks    Types: 9 Glasses of wine per week  . Drug use: No  . Sexual activity: Yes    Birth control/protection: Post-menopausal  Other Topics Concern  . Not on file  Social History  Narrative  . Not on file   Social Determinants of Health   Financial Resource Strain:   . Difficulty of Paying Living Expenses: Not on file  Food Insecurity:   . Worried About Charity fundraiser in the Last Year: Not on file  . Ran Out of Food in the Last Year: Not on file  Transportation Needs:   . Lack of Transportation (Medical): Not on file  . Lack of Transportation (Non-Medical): Not on file  Physical Activity:   . Days of Exercise per Week: Not on file  . Minutes of Exercise per Session: Not on file  Stress:   . Feeling of Stress : Not on file  Social Connections:   . Frequency of Communication with Friends and Family: Not on file  . Frequency of Social Gatherings with Friends and Family: Not on file  . Attends Religious Services: Not on file  . Active Member of Clubs or Organizations: Not on file  . Attends Archivist Meetings: Not on file  . Marital Status: Not on file  Intimate Partner Violence:   . Fear of Current or Ex-Partner: Not on file  . Emotionally Abused: Not on file  .  Physically Abused: Not on file  . Sexually Abused: Not on file     Constitutional: Denies fever, malaise, fatigue, headache or abrupt weight changes.  HEENT: Pt reports sensation of foreign body in throat. Denies eye pain, eye redness, ear pain, ringing in the ears, wax buildup, runny nose, nasal congestion, bloody nose, or sore throat. Respiratory: Denies difficulty breathing, shortness of breath, cough or sputum production.   Cardiovascular: Denies chest pain, chest tightness, palpitations or swelling in the hands or feet.  Gastrointestinal: Denies abdominal pain, bloating, constipation, diarrhea or blood in the stool.   No other specific complaints in a complete review of systems (except as listed in HPI above).  Objective:   Physical Exam   BP 114/74   Pulse 74   Temp (!) 97 F (36.1 C) (Temporal)   Wt 155 lb (70.3 kg)   SpO2 98%   BMI 25.79 kg/m  Wt Readings from  Last 3 Encounters:  01/24/20 155 lb (70.3 kg)  11/07/19 154 lb (69.9 kg)  03/14/18 153 lb (69.4 kg)    General: Appears her stated age, well developed, well nourished in NAD. Skin: Warm, dry and intact. No rashes, lesions or ulcerations noted. HEENT: Head: normal shape and size; Throat/Mouth: Teeth present, mucosa pink and moist, no exudate, lesions or ulcerations noted.  Neck:  Neck supple, trachea midline. No masses, lumps palpable. She feels discomfort superior and lateral to the right thyroid lobe. Cardiovascular: Normal rate and rhythm. S1,S2 noted.  No murmur, rubs or gallops noted. Pulmonary/Chest: Normal effort and positive vesicular breath sounds. No respiratory distress. No wheezes, rales or ronchi noted.  Neurological: Alert and oriented.   BMET    Component Value Date/Time   NA 139 11/07/2019 1518   K 4.3 11/07/2019 1518   CL 102 11/07/2019 1518   CO2 29 11/07/2019 1518   GLUCOSE 98 11/07/2019 1518   BUN 10 11/07/2019 1518   CREATININE 0.72 11/07/2019 1518   CALCIUM 9.9 11/07/2019 1518    Lipid Panel     Component Value Date/Time   CHOL 248 (H) 11/07/2019 1518   TRIG 65.0 11/07/2019 1518   HDL 79.10 11/07/2019 1518   CHOLHDL 3 11/07/2019 1518   VLDL 13.0 11/07/2019 1518   LDLCALC 156 (H) 11/07/2019 1518    CBC    Component Value Date/Time   WBC 5.2 11/07/2019 1518   RBC 4.13 11/07/2019 1518   HGB 13.3 11/07/2019 1518   HCT 39.6 11/07/2019 1518   PLT 210.0 11/07/2019 1518   MCV 95.8 11/07/2019 1518   MCHC 33.7 11/07/2019 1518   RDW 13.1 11/07/2019 1518    Hgb A1C No results found for: HGBA1C         Assessment & Plan:   Sensation of Foreign Body in Throat:  DG soft tissue neck shows calcifications in thyroid and right carotid US Right Carotid and US Thyroid ordered Consider referral to ENT vs Endocrinology vs Cardiology/Vascular pending ultrasound results  Will follow up after ultrasound, return precautions discussed Webb Silversmith,  NP This visit occurred during the SARS-CoV-2 public health emergency.  Safety protocols were in place, including screening questions prior to the visit, additional usage of staff PPE, and extensive cleaning of exam room while observing appropriate contact time as indicated for disinfecting solutions.

## 2020-01-24 NOTE — Patient Instructions (Signed)
Thyroid Nodule  A thyroid nodule is an isolated growth of thyroid cells that forms a lump in your thyroid gland. The thyroid gland is a butterfly-shaped gland. It is found in the lower front of your neck. This gland sends chemical messengers (hormones) through your blood to all parts of your body. These hormones are important in regulating your body temperature and helping your body to use energy. Thyroid nodules are common. Most are not cancerous (benign). You may have one nodule or several nodules. Different types of thyroid nodules include nodules that:  Grow and fill with fluid (thyroid cysts).  Produce too much thyroid hormone (hot nodules or hyperthyroid).  Produce no thyroid hormone (cold nodules or hypothyroid).  Form from cancer cells (thyroid cancers). What are the causes? In most cases, the cause of this condition is not known. What increases the risk? The following factors may make you more likely to develop this condition.  Age. Thyroid nodules become more common in people who are older than 65 years of age.  Gender. ? Benign thyroid nodules are more common in women. ? Cancerous (malignant) thyroid nodules are more common in men.  A family history that includes: ? Thyroid nodules. ? Pheochromocytoma. ? Thyroid carcinoma. ? Hyperparathyroidism.  Certain kinds of thyroid diseases, such as Hashimoto's thyroiditis.  Lack of iodine in your diet.  A history of head and neck radiation, such as from previous cancer treatment. What are the signs or symptoms? In many cases, there are no symptoms. If you have symptoms, they may include:  A lump in your lower neck.  Feeling a lump or tickle in your throat.  Pain in your neck, jaw, or ear.  Having trouble swallowing. Hot nodules may cause symptoms that include:  Weight loss.  Warm, flushed skin.  Feeling hot.  Feeling nervous.  A racing heartbeat. Cold nodules may cause symptoms that include:  Weight  gain.  Dry skin.  Brittle hair. This may also occur with hair loss.  Feeling cold.  Fatigue. Thyroid cancer nodules may cause symptoms that include:  Hard nodules that feel stuck to the thyroid gland.  Hoarseness.  Lumps in the glands near your thyroid (lymph nodes). How is this diagnosed? A thyroid nodule may be felt by your health care provider during a physical exam. This condition may also be diagnosed based on your symptoms. You may also have tests, including:  An ultrasound. This may be done to confirm the diagnosis.  A biopsy. This involves taking a sample from the nodule and looking at it under a microscope.  Blood tests to make sure that your thyroid is working properly.  A thyroid scan. This test uses a radioactive tracer injected into a vein to create an image of the thyroid gland on a computer screen.  Imaging tests such as MRI or CT scan. These may be done if: ? Your nodule is large. ? Your nodule is blocking your airway. ? Cancer is suspected. How is this treated? Treatment depends on the cause and size of your nodule or nodules. If the nodule is benign, treatment may not be necessary. Your health care provider may monitor the nodule to see if it goes away without treatment. If the nodule continues to grow, is cancerous, or does not go away, treatment may be needed. Treatment may include:  Having a cystic nodule drained with a needle.  Ablation therapy. In this treatment, alcohol is injected into the area of the nodule to destroy the cells. Ablation with heat (  thermal ablation) may also be used.  Radioactive iodine. In this treatment, radioactive iodine is given as a pill or liquid that you drink. This substance causes the thyroid nodule to shrink.  Surgery to remove the nodule. Part or all of your thyroid gland may need to be removed as well.  Medicines. Follow these instructions at home:  Pay attention to any changes in your nodule.  Take  over-the-counter and prescription medicines only as told by your health care provider.  Keep all follow-up visits as told by your health care provider. This is important. Contact a health care provider if:  Your voice changes.  You have trouble swallowing.  You have pain in your neck, ear, or jaw that is getting worse.  Your nodule gets bigger.  Your nodule starts to make it harder for you to breathe.  Your muscles look like they are shrinking (muscle wasting). Get help right away if:  You have chest pain.  There is a loss of consciousness.  You have a sudden fever.  You feel confused.  You are seeing or hearing things that other people do not see or hear (having hallucinations).  You feel very weak.  You have mood swings.  You feel very restless.  You feel suddenly nauseous or throw up.  You suddenly have diarrhea. Summary  A thyroid nodule is an isolated growth of thyroid cells that forms a lump in your thyroid gland.  Thyroid nodules are common. Most are not cancerous (benign). You may have one nodule or several nodules.  Treatment depends on the cause and size of your nodule or nodules. If the nodule is benign, treatment may not be necessary.  Your health care provider may monitor the nodule to see if it goes away without treatment. If the nodule continues to grow, is cancerous, or does not go away, treatment may be needed. This information is not intended to replace advice given to you by your health care provider. Make sure you discuss any questions you have with your health care provider. Document Revised: 10/28/2017 Document Reviewed: 10/31/2017 Elsevier Patient Education  [place].

## 2020-01-25 ENCOUNTER — Ambulatory Visit
Admission: RE | Admit: 2020-01-25 | Discharge: 2020-01-25 | Disposition: A | Discharge status: Home / Self Care | Payer: Medicare HMO | Source: Ambulatory Visit | Attending: Internal Medicine | Admitting: Internal Medicine

## 2020-01-25 ENCOUNTER — Other Ambulatory Visit: Payer: Self-pay

## 2020-01-25 ENCOUNTER — Telehealth: Payer: Self-pay | Admitting: Internal Medicine

## 2020-01-25 DIAGNOSIS — E0789 Other specified disorders of thyroid: Secondary | ICD-10-CM

## 2020-01-25 DIAGNOSIS — I6521 Occlusion and stenosis of right carotid artery: Secondary | ICD-10-CM | POA: Insufficient documentation

## 2020-01-25 DIAGNOSIS — R0989 Other specified symptoms and signs involving the circulatory and respiratory systems: Secondary | ICD-10-CM | POA: Diagnosis not present

## 2020-01-25 DIAGNOSIS — I672 Cerebral atherosclerosis: Secondary | ICD-10-CM | POA: Diagnosis not present

## 2020-01-25 DIAGNOSIS — E041 Nontoxic single thyroid nodule: Secondary | ICD-10-CM | POA: Diagnosis not present

## 2020-01-25 NOTE — Telephone Encounter (Signed)
Patient has appointment for ultrasound at [facility] at 4:00.  Patient wants to know if she can speak to [facility] before she goes for the ultrasound?

## 2020-01-25 NOTE — Telephone Encounter (Signed)
Pt call by provider, questions answered

## 2020-01-28 ENCOUNTER — Telehealth: Payer: Self-pay | Admitting: Internal Medicine

## 2020-01-28 ENCOUNTER — Other Ambulatory Visit: Payer: Self-pay | Admitting: Internal Medicine

## 2020-01-28 DIAGNOSIS — T17208D Unspecified foreign body in pharynx causing other injury, subsequent encounter: Secondary | ICD-10-CM

## 2020-01-28 NOTE — Telephone Encounter (Signed)
Pt called in wanted to know about the referral to the gastroenterologist for a test. Please advise.. call back number  [phone]

## 2020-01-28 NOTE — Progress Notes (Signed)
en

## 2020-01-29 NOTE — Telephone Encounter (Signed)
She is seeing ENT, not GI

## 2020-01-30 ENCOUNTER — Ambulatory Visit: Payer: Medicare HMO

## 2020-01-31 ENCOUNTER — Ambulatory Visit (INDEPENDENT_AMBULATORY_CARE_PROVIDER_SITE_OTHER): Payer: Medicare HMO | Admitting: Otolaryngology

## 2020-01-31 ENCOUNTER — Encounter (INDEPENDENT_AMBULATORY_CARE_PROVIDER_SITE_OTHER): Payer: Self-pay | Admitting: Otolaryngology

## 2020-01-31 ENCOUNTER — Other Ambulatory Visit: Payer: Self-pay

## 2020-01-31 VITALS — Temp 97.2°F

## 2020-01-31 DIAGNOSIS — R198 Other specified symptoms and signs involving the digestive system and abdomen: Secondary | ICD-10-CM | POA: Diagnosis not present

## 2020-01-31 DIAGNOSIS — R0989 Other specified symptoms and signs involving the circulatory and respiratory systems: Secondary | ICD-10-CM

## 2020-01-31 NOTE — Progress Notes (Signed)
HPI: Shelby Griffith is a 64 y.o. female who presents is referred by Dr. Silvio Pate For evaluation of sensation of a foreign body in her throat.  This initially occurred approximately 3 weeks ago while patient was eating salmon.  She apparently developed some discomfort on the right lower neck when she swallows as if she had a foreign body or bone stuck in her throat.  Symptoms are all on the right side.  It hurts when she swallows.  She has no difficulty eating food but it causes discomfort. She does have history of GERD for which she takes Pepcid. She had a plain x-ray of her neck that did not demonstrate any bony abnormality although she has some calcification of the thyroid cartilage and carotid arteries.  She underwent ultrasound of her carotid arteries that did not demonstrate any significant stenosis. She had a previous right thyroid lobectomy performed a number of years ago.  This was benign..  Past Medical History:  Diagnosis Date  . Arthritis    left hip  . GERD (gastroesophageal reflux disease)    occasional    Past Surgical History:  Procedure Laterality Date  . CESAREAN SECTION     x 3  . COLONOSCOPY WITH PROPOFOL N/A 06/14/2017   Procedure: COLONOSCOPY WITH PROPOFOL;  Surgeon: Lin Landsman, MD;  Location: [facility] ENDOSCOPY;  Service: Gastroenterology;  Laterality: N/A;  . ENDOMETRIAL ABLATION    . THYROID LOBECTOMY Right 1986  . thyroid lobectomy Right   . TONSILLECTOMY  1977  . VARICOSE VEIN SURGERY Bilateral    Social History   Socioeconomic History  . Marital status: Married    Spouse name: Not on file  . Number of children: Not on file  . Years of education: Not on file  . Highest education level: Not on file  Occupational History  . Not on file  Tobacco Use  . Smoking status: Never Smoker  . Smokeless tobacco: Never Used  Vaping Use  . Vaping Use: Never used  Substance and Sexual Activity  . Alcohol use: Yes    Alcohol/week: 9.0 standard drinks     Types: 9 Glasses of wine per week  . Drug use: No  . Sexual activity: Yes    Birth control/protection: Post-menopausal  Other Topics Concern  . Not on file  Social History Narrative  . Not on file   Social Determinants of Health   Financial Resource Strain:   . Difficulty of Paying Living Expenses: Not on file  Food Insecurity:   . Worried About Charity fundraiser in the Last Year: Not on file  . Ran Out of Food in the Last Year: Not on file  Transportation Needs:   . Lack of Transportation (Medical): Not on file  . Lack of Transportation (Non-Medical): Not on file  Physical Activity:   . Days of Exercise per Week: Not on file  . Minutes of Exercise per Session: Not on file  Stress:   . Feeling of Stress : Not on file  Social Connections:   . Frequency of Communication with Friends and Family: Not on file  . Frequency of Social Gatherings with Friends and Family: Not on file  . Attends Religious Services: Not on file  . Active Member of Clubs or Organizations: Not on file  . Attends Archivist Meetings: Not on file  . Marital Status: Not on file   Family History  Problem Relation Age of Onset  . Heart failure Mother   . Lung  cancer Paternal Grandfather   . Cancer Sister        Glioblastoma  . Breast cancer Neg Hx   . Esophageal cancer Neg Hx   . Stomach cancer Neg Hx    Allergies  Allergen Reactions  . Penicillins Rash   Prior to Admission medications   Medication Sig Start Date End Date Taking? Authorizing Provider  Cholecalciferol (VITAMIN D) 10 MCG/ML LIQD Take 1 drop by mouth daily.   Yes [provider]  famotidine (PEPCID) 10 MG tablet Take 10 mg by mouth 2 (two) times daily.   Yes [provider]     Positive ROS: Otherwise negative  All other systems have been reviewed and were otherwise negative with the exception of those mentioned in the HPI and as above.  Physical Exam: Constitutional: Alert, well-appearing, no acute  distress Ears: External ears without lesions or tenderness. Ear canals are clear bilaterally with intact, clear TMs.  Nasal: External nose without lesions. Septum midline. Clear nasal passages bilaterally. Oral: Lips and gums without lesions. Tongue and palate mucosa without lesions. Posterior oropharynx clear.  Patient is status post tonsillectomy.  On visual examination and indirect laryngoscopy there is no obvious abnormality noted within the tonsil fossa or base of tongue.  Palpation of the base of tongue and tonsil fossa revealed no palpable abnormality. Fiberoptic laryngoscopy was performed to the left nostril and on fiberoptic laryngoscopy the nasopharynx was clear.  The base of tongue vallecula epiglottis were normal.  Vocal cords were clear bilaterally.  She had mild arytenoid edema but no significant erythema and no mucosal abnormalities noted.  Vocal cords were clear and both piriform sinuses were clear.  I was able to pass the fiberoptic laryngoscope through the upper esophageal sphincter without difficulty and did not observe any obvious foreign body.  But visualization was limited. Neck: No palpable adenopathy or masses Respiratory: Breathing comfortably  Skin: No facial/neck lesions or rash noted.  Laryngoscopy  Date/Time: 01/31/2020 5:42 PM Performed by: Rozetta Nunnery, MD Authorized by: Rozetta Nunnery, MD   Consent:    Consent obtained:  Verbal   Consent given by:  Patient Procedure details:    Indications: direct visualization of the upper aerodigestive tract     Medication:  Afrin   Instrument: flexible fiberoptic laryngoscope     Scope location: left nare   Sinus:    Left nasopharynx: normal   Mouth:    Oropharynx: normal     Vallecula: normal     Base of tongue: normal     Epiglottis: normal   Throat:    Right hypopharynx: normal     Left hypopharynx: normal     Pyriform sinus: normal     True vocal cords: normal      Assessment: Chronic right  sided foreign body sensation x3 weeks.  In the region of the upper esophageal sphincter. History of GERD. I could not find any foreign body on upper airway examination with fiberoptic laryngoscopy but I have limited visualization of the upper esophageal sphincter where she feels like it might be located.  Plan: Prescribed omeprazole 40 mg daily before dinner. She could consider upper endoscopy with gastroenterology as they will have better visualization of the upper esophageal sphincter region.  And this can be performed under IV sedation. The other option would consist of general anesthesia and direct laryngoscopy by myself but I would wait 2 weeks and try the omeprazole and discussed this with the patient. She expressed interest in having  upper endoscopy with GI as this has been very bothersome to her.   Radene Journey, MD   CC:

## 2020-02-01 ENCOUNTER — Telehealth: Payer: Self-pay | Admitting: Internal Medicine

## 2020-02-01 DIAGNOSIS — T18108A Unspecified foreign body in esophagus causing other injury, initial encounter: Secondary | ICD-10-CM

## 2020-02-01 DIAGNOSIS — R0989 Other specified symptoms and signs involving the circulatory and respiratory systems: Secondary | ICD-10-CM

## 2020-02-01 DIAGNOSIS — R198 Other specified symptoms and signs involving the digestive system and abdomen: Secondary | ICD-10-CM

## 2020-02-01 NOTE — Addendum Note (Signed)
Addended by: Jearld Fenton on: 02/01/2020 04:27 PM   Modules accepted: Orders

## 2020-02-01 NOTE — Telephone Encounter (Signed)
Saw the ENT and wanted to talk about the visit and the follow up plan . And she really wants to talk to [employer].

## 2020-02-01 NOTE — Telephone Encounter (Signed)
Jacquie notified by telephone that R.Baity will return her call today around 5:00 pm.

## 2020-02-01 NOTE — Telephone Encounter (Signed)
Please let her know I will call her but it will be this afternoon around 5

## 2020-02-04 ENCOUNTER — Telehealth: Payer: Self-pay | Admitting: Gastroenterology

## 2020-02-04 ENCOUNTER — Telehealth: Payer: Self-pay | Admitting: Internal Medicine

## 2020-02-04 ENCOUNTER — Encounter: Payer: Self-pay | Admitting: Gastroenterology

## 2020-02-04 ENCOUNTER — Ambulatory Visit: Payer: Medicare HMO | Admitting: Gastroenterology

## 2020-02-04 ENCOUNTER — Other Ambulatory Visit: Payer: Self-pay

## 2020-02-04 VITALS — BP 168/75 | HR 85 | Temp 98.0°F | Ht 65.0 in | Wt 152.2 lb

## 2020-02-04 DIAGNOSIS — T18108A Unspecified foreign body in esophagus causing other injury, initial encounter: Secondary | ICD-10-CM

## 2020-02-04 DIAGNOSIS — T189XXA Foreign body of alimentary tract, part unspecified, initial encounter: Secondary | ICD-10-CM | POA: Diagnosis not present

## 2020-02-04 NOTE — Telephone Encounter (Signed)
Pt called needing to get a referral to dr Marius Ditch  Her appointment is today @ 2  Salmon bone stuck in esophagus  Please let pt know when this has been done Pt is aware regina out of office this am

## 2020-02-04 NOTE — Telephone Encounter (Signed)
Patient stated that she has already informed [facility] that she wants to continue care with Dr. Marius Ditch and asked [facility] could they send over a referral to [facility].

## 2020-02-04 NOTE — Progress Notes (Signed)
Cephas Darby, MD [address]  [place]  [place], [place] [address]  Main: [phone]  Fax: [phone]    Gastroenterology Consultation  Referring Provider:     Jearld Fenton, NP Primary Care Physician:  Jearld Fenton, NP Primary Gastroenterologist:  Dr. Cephas Darby Reason for Consultation:     Foreign body in throat        HPI:   Shelby Griffith is a 64 y.o. female referred by Dr. Jearld Fenton, NP  for consultation & management of foreign body in throat.  Patient reports that about 3 weeks ago, when she was on vacation, she had salmon and when she swallowed her last bite, she felt a fishbone was stuck in her throat.  Since then, she has been having trouble swallowing, particularly hard foods.  She had x-ray neck which did not reveal any foreign body.  She underwent ENT evaluation which was unremarkable.  She has been on Pepcid for a long time.  She does have intermittent heartburn sometimes nocturnal heartburn as well.  She has lost about 5 pounds in last 3 weeks due to decreased p.o. intake  She does not smoke.  She does drink a glass of wine daily at night  NSAIDs: None  Antiplts/Anticoagulants/Anti thrombotics: None  GI Procedures: Colonoscopy 05/2017 DIAGNOSIS:  A. COLON POLYP X 2, ASCENDING; HOT SNARE:  - SESSILE SERRATED ADENOMAS, MULTIPLE FRAGMENTS.  - NEGATIVE FOR CYTOLOGIC DYSPLASIA AND MALIGNANCY.   B. COLON POLYP X 2, TRANSVERSE; HOT SNARE:  - TWO FRAGMENTS CONSISTENT WITH HYPERPLASTIC POLYPS, WITH CAUTERY  ARTIFACT.  - SEVERAL SMALLER CAUTERIZED FRAGMENTS.   C. COLON POLYP X 2, DESCENDING; COLD SNARE AND HOT SNARE:  - HYPERPLASTIC POLYP, 1 FRAGMENT.  - ONE FRAGMENT CONSISTENT WITH HYPERPLASTIC POLYP, WITH CAUTERY  ARTIFACT.  - SEVERAL SMALLER CAUTERIZED FRAGMENTS.   Past Medical History:  Diagnosis Date  . Arthritis    left hip  . GERD (gastroesophageal reflux disease)    occasional     Past Surgical History:  Procedure  Laterality Date  . CESAREAN SECTION     x 3  . COLONOSCOPY WITH PROPOFOL N/A 06/14/2017   Procedure: COLONOSCOPY WITH PROPOFOL;  Surgeon: Lin Landsman, MD;  Location: [facility] ENDOSCOPY;  Service: Gastroenterology;  Laterality: N/A;  . ENDOMETRIAL ABLATION    . THYROID LOBECTOMY Right 1986  . thyroid lobectomy Right   . TONSILLECTOMY  1977  . VARICOSE VEIN SURGERY Bilateral     Current Outpatient Medications:  .  Cholecalciferol (VITAMIN D) 10 MCG/ML LIQD, Take 1 drop by mouth daily., Disp: , Rfl:  .  famotidine (PEPCID) 10 MG tablet, Take 10 mg by mouth 2 (two) times daily., Disp: , Rfl:  .  ketoconazole (NIZORAL) 2 % shampoo, Apply topically., Disp: , Rfl:    Family History  Problem Relation Age of Onset  . Heart failure Mother   . Lung cancer Paternal Grandfather   . Cancer Sister        Glioblastoma  . Breast cancer Neg Hx   . Esophageal cancer Neg Hx   . Stomach cancer Neg Hx      Social History   Tobacco Use  . Smoking status: Never Smoker  . Smokeless tobacco: Never Used  Vaping Use  . Vaping Use: Never used  Substance Use Topics  . Alcohol use: Yes    Alcohol/week: 9.0 standard drinks    Types: 9 Glasses of wine per week  . Drug use: No  Allergies as of 02/04/2020 - Review Complete 02/04/2020  Allergen Reaction Noted  . Penicillins Rash 05/09/2015    Review of Systems:    All systems reviewed and negative except where noted in HPI.   Physical Exam:  BP (!) 168/75 (BP Location: Left Arm, Patient Position: Sitting, Cuff Size: Normal)   Pulse 85   Temp 98 F (36.7 C) (Oral)   Ht 5\' 5"  (1.651 m)   Wt 152 lb 4 oz (69.1 kg)   BMI 25.34 kg/m  No LMP recorded. Patient is postmenopausal.  General:   Alert,  Well-developed, well-nourished, pleasant and cooperative in NAD Head:  Normocephalic and atraumatic. Eyes:  Sclera clear, no icterus.   Conjunctiva pink. Ears:  Normal auditory acuity. Nose:  No deformity, discharge, or lesions. Mouth:  No  deformity or lesions,oropharynx pink & moist. Neck:  Supple; no masses or thyromegaly. Lungs:  Respirations even and unlabored.  Clear throughout to auscultation.   No wheezes, crackles, or rhonchi. No acute distress. Heart:  Regular rate and rhythm; no murmurs, clicks, rubs, or gallops. Abdomen:  Normal bowel sounds. Soft, non-tender and non-distended without masses, hepatosplenomegaly or hernias noted.  No guarding or rebound tenderness.   Rectal: Not performed Msk:  Symmetrical without gross deformities. Good, equal movement & strength bilaterally. Pulses:  Normal pulses noted. Extremities:  No clubbing or edema.  No cyanosis. Neurologic:  Alert and oriented x3;  grossly normal neurologically. Skin:  Intact without significant lesions or rashes. No jaundice. Psych:  Alert and cooperative. Normal mood and affect.  Imaging Studies: Reviewed  Assessment and Plan:   Shelby Griffith is a 64 y.o. female with history of chronic heartburn, is seen in consultation for possible fishbone stuck in her throat resulting in dysphagia to solids and weight loss  Recommend EGD on Thursday this week for further evaluation Start omeprazole 20 mg twice daily before meals Avoid eating hard foods until EGD   Follow up as needed   Cephas Darby, MD

## 2020-02-05 ENCOUNTER — Other Ambulatory Visit
Admission: RE | Admit: 2020-02-05 | Discharge: 2020-02-05 | Disposition: A | Discharge status: Home / Self Care | Payer: Medicare HMO | Source: Ambulatory Visit | Attending: Gastroenterology | Admitting: Gastroenterology

## 2020-02-05 DIAGNOSIS — Z20822 Contact with and (suspected) exposure to covid-19: Secondary | ICD-10-CM | POA: Diagnosis not present

## 2020-02-05 DIAGNOSIS — Z01818 Encounter for other preprocedural examination: Secondary | ICD-10-CM | POA: Diagnosis not present

## 2020-02-05 LAB — SARS CORONAVIRUS 2 (TAT 6-24 HRS): SARS Coronavirus 2: NEGATIVE

## 2020-02-05 NOTE — Telephone Encounter (Signed)
This has been addressed.

## 2020-02-07 ENCOUNTER — Ambulatory Visit: Payer: Medicare HMO | Admitting: Certified Registered"

## 2020-02-07 ENCOUNTER — Other Ambulatory Visit: Payer: Self-pay

## 2020-02-07 ENCOUNTER — Ambulatory Visit
Admission: RE | Admit: 2020-02-07 | Discharge: 2020-02-07 | Disposition: A | Discharge status: Home / Self Care | Payer: Medicare HMO | Attending: Gastroenterology | Admitting: Gastroenterology

## 2020-02-07 ENCOUNTER — Encounter: Payer: Self-pay | Admitting: Gastroenterology

## 2020-02-07 ENCOUNTER — Encounter
Admission: RE | Disposition: A | Discharge status: Home / Self Care | Payer: Self-pay | Source: Home / Self Care | Attending: Gastroenterology

## 2020-02-07 DIAGNOSIS — R1314 Dysphagia, pharyngoesophageal phase: Secondary | ICD-10-CM | POA: Diagnosis not present

## 2020-02-07 DIAGNOSIS — T18108A Unspecified foreign body in esophagus causing other injury, initial encounter: Secondary | ICD-10-CM

## 2020-02-07 HISTORY — PX: ESOPHAGOGASTRODUODENOSCOPY (EGD) WITH PROPOFOL: SHX5813

## 2020-02-07 SURGERY — ESOPHAGOGASTRODUODENOSCOPY (EGD) WITH PROPOFOL
Anesthesia: General

## 2020-02-07 MED ORDER — MIDAZOLAM HCL 2 MG/2ML IJ SOLN
INTRAMUSCULAR | Status: AC
  Filled 2020-02-07: qty 2

## 2020-02-07 MED ORDER — OMEPRAZOLE 20 MG PO CPDR: 20.0000 mg | DELAYED_RELEASE_CAPSULE | Freq: Two times a day (BID) | ORAL | 0 refills | Status: DC

## 2020-02-07 MED ORDER — GLYCOPYRROLATE 0.2 MG/ML IJ SOLN
INTRAMUSCULAR | Status: DC | PRN
  Administered 2020-02-07: .2 mg via INTRAVENOUS

## 2020-02-07 MED ORDER — LIDOCAINE 2% (20 MG/ML) 5 ML SYRINGE
INTRAMUSCULAR | Status: DC | PRN
  Administered 2020-02-07: 25 mg via INTRAVENOUS

## 2020-02-07 MED ORDER — PROPOFOL 500 MG/50ML IV EMUL
INTRAVENOUS | Status: DC | PRN
  Administered 2020-02-07: 120 ug/kg/min via INTRAVENOUS

## 2020-02-07 MED ORDER — PROPOFOL 10 MG/ML IV BOLUS
INTRAVENOUS | Status: DC | PRN
  Administered 2020-02-07 (×2): 70 mg via INTRAVENOUS

## 2020-02-07 MED ORDER — SUCRALFATE 1 GM/10ML PO SUSP: 1.0000 g | Freq: Three times a day (TID) | ORAL | 0 refills | Status: DC | PRN

## 2020-02-07 MED ORDER — GLYCOPYRROLATE 0.2 MG/ML IJ SOLN
INTRAMUSCULAR | Status: AC
  Filled 2020-02-07: qty 1

## 2020-02-07 MED ORDER — MIDAZOLAM HCL 5 MG/5ML IJ SOLN
INTRAMUSCULAR | Status: DC | PRN
  Administered 2020-02-07: 2 mg via INTRAVENOUS

## 2020-02-07 MED ORDER — SODIUM CHLORIDE 0.9 % IV SOLN: INTRAVENOUS | Status: DC

## 2020-02-07 NOTE — H&P (Signed)
Shelby Darby, MD [address]  [place]  [place], [place] [address]  Main: [phone]  Fax: [phone] Pager: [phone]  Primary Care Physician:  Jearld Fenton, NP Primary Gastroenterologist:  Dr. Cephas Griffith  Pre-Procedure History & Physical: HPI:  Shelby Griffith is a 64 y.o. female is here for an endoscopy.   Past Medical History:  Diagnosis Date   Arthritis    left hip   GERD (gastroesophageal reflux disease)    occasional     Past Surgical History:  Procedure Laterality Date   CESAREAN SECTION     x 3   COLONOSCOPY WITH PROPOFOL N/A 06/14/2017   Procedure: COLONOSCOPY WITH PROPOFOL;  Surgeon: Lin Landsman, MD;  Location: [facility] ENDOSCOPY;  Service: Gastroenterology;  Laterality: N/A;   ENDOMETRIAL ABLATION     THYROID LOBECTOMY Right 1986   thyroid lobectomy Right    TONSILLECTOMY  1977   VARICOSE VEIN SURGERY Bilateral     Prior to Admission medications   Medication Sig Start Date End Date Taking? Authorizing Provider  Cholecalciferol (VITAMIN D) 10 MCG/ML LIQD Take 1 drop by mouth daily.    [provider]  famotidine (PEPCID) 10 MG tablet Take 10 mg by mouth 2 (two) times daily.    [provider]  ketoconazole (NIZORAL) 2 % shampoo Apply topically. 02/27/18   [provider]    Allergies as of 02/04/2020 - Review Complete 02/04/2020  Allergen Reaction Noted   Penicillins Rash 05/09/2015    Family History  Problem Relation Age of Onset   Heart failure Mother    Lung cancer Paternal Grandfather    Cancer Sister        Glioblastoma   Breast cancer Neg Hx    Esophageal cancer Neg Hx    Stomach cancer Neg Hx     Social History   Socioeconomic History   Marital status: Married    Spouse name: Not on file   Number of children: Not on file   Years of education: Not on file   Highest education level: Not on file  Occupational History   Not on file  Tobacco Use   Smoking status: Never Smoker    Smokeless tobacco: Never Used  Vaping Use   Vaping Use: Never used  Substance and Sexual Activity   Alcohol use: Yes    Alcohol/week: 9.0 standard drinks    Types: 9 Glasses of wine per week   Drug use: No   Sexual activity: Yes    Birth control/protection: Post-menopausal  Other Topics Concern   Not on file  Social History Narrative   Not on file   Social Determinants of Health   Financial Resource Strain:    Difficulty of Paying Living Expenses: Not on file  Food Insecurity:    Worried About Charity fundraiser in the Last Year: Not on file   [employer] of Food in the Last Year: Not on file  Transportation Needs:    Lack of Transportation (Medical): Not on file   Lack of Transportation (Non-Medical): Not on file  Physical Activity:    Days of Exercise per Week: Not on file   Minutes of Exercise per Session: Not on file  Stress:    Feeling of Stress : Not on file  Social Connections:    Frequency of Communication with Friends and Family: Not on file   Frequency of Social Gatherings with Friends and Family: Not on file   Attends Religious Services: Not  on file   Active Member of Clubs or Organizations: Not on file   Attends Archivist Meetings: Not on file   Marital Status: Not on file  Intimate Partner Violence:    Fear of Current or Ex-Partner: Not on file   Emotionally Abused: Not on file   Physically Abused: Not on file   Sexually Abused: Not on file    Review of Systems: See HPI, otherwise negative ROS  Physical Exam: BP 140/65   Pulse 71   Temp (!) 96.6 F (35.9 C)   Resp 16   Ht 5\' 5"  (1.651 m)   Wt 150 lb (68 kg)   SpO2 100%   BMI 24.96 kg/m  General:   Alert,  pleasant and cooperative in NAD Head:  Normocephalic and atraumatic. Neck:  Supple; no masses or thyromegaly. Lungs:  Clear throughout to auscultation.    Heart:  Regular rate and rhythm. Abdomen:  Soft, nontender and nondistended. Normal bowel sounds, without guarding, and without  rebound.   Neurologic:  Alert and  oriented x4;  grossly normal neurologically.  Impression/Plan: Shelby Griffith is here for an endoscopy to be performed for possible fish bone impaction  Risks, benefits, limitations, and alternatives regarding  endoscopy have been reviewed with the patient.  Questions have been answered.  All parties agreeable.   Sherri Sear, MD  02/07/2020, 11:12 AM

## 2020-02-07 NOTE — Op Note (Signed)
[facility] Gastroenterology Patient Name: Shelby Griffith Procedure Date: 02/07/2020 11:10 AM MRN: [id number] Account #: [id number] Date of Birth: May 16, 1955 Admit Type: Outpatient Age: 64 Room: [facility] ENDO ROOM 4 Gender: Female Note Status: Finalized Procedure:             Upper GI endoscopy Indications:           Esophageal dysphagia, Foreign body in the esophagus Providers:             Lin Landsman MD, MD Referring MD:          Jearld Fenton (Referring MD) Medicines:             General Anesthesia Complications:         No immediate complications. Estimated blood loss: None. Procedure:             Pre-Anesthesia Assessment:                        - Prior to the procedure, a History and Physical was                         performed, and patient medications and allergies were                         reviewed. The patient is competent. The risks and                         benefits of the procedure and the sedation options and                         risks were discussed with the patient. All questions                         were answered and informed consent was obtained.                         Patient identification and proposed procedure were                         verified by the physician, the nurse, the                         anesthesiologist, the anesthetist and the technician                         in the pre-procedure area in the procedure room in the                         endoscopy suite. Mental Status Examination: alert and                         oriented. Airway Examination: normal oropharyngeal                         airway and neck mobility. Respiratory Examination:                         clear to auscultation. CV Examination: normal.  Prophylactic Antibiotics: The patient does not require                         prophylactic antibiotics. Prior Anticoagulants: The                         patient has taken no  previous anticoagulant or                         antiplatelet agents. ASA Grade Assessment: II - A                         patient with mild systemic disease. After reviewing                         the risks and benefits, the patient was deemed in                         satisfactory condition to undergo the procedure. The                         anesthesia plan was to use general anesthesia.                         Immediately prior to administration of medications,                         the patient was re-assessed for adequacy to receive                         sedatives. The heart rate, respiratory rate, oxygen                         saturations, blood pressure, adequacy of pulmonary                         ventilation, and response to care were monitored                         throughout the procedure. The physical status of the                         patient was re-assessed after the procedure.                        After obtaining informed consent, the endoscope was                         passed under direct vision. Throughout the procedure,                         the patient's blood pressure, pulse, and oxygen                         saturations were monitored continuously. The Endoscope                         was introduced through the mouth, and advanced to the  second part of duodenum. The upper GI endoscopy was                         accomplished without difficulty. The patient tolerated                         the procedure well. Findings:      The GE junction and entire esophagus was normal, no evidence of fish       bone on thorough exam. Random Biopsies were taken with a cold forceps       for histology.      The stomach was normal.      The examined duodenum was normal.      Esophagogastric landmarks were identified: the gastroesophageal junction       was found at 40 cm from the incisors.      The oropharynx was also normal.      The  cardia and gastric fundus were normal on retroflexion. Impression:            - Normal esophagus. Biopsied.                        - Normal stomach.                        - Normal examined duodenum.                        - Esophagogastric landmarks identified.                        - Normal oropharynx. Recommendation:        - Await pathology results.                        - Discharge patient to home (with escort).                        - Resume regular diet today.                        - Continue present medications. Procedure Code(s):     --- Professional ---                        [phone], Esophagogastroduodenoscopy, flexible,                         transoral; with biopsy, single or multiple Diagnosis Code(s):     --- Professional ---                        R13.14, Dysphagia, pharyngoesophageal phase                        T18.108A, Unspecified foreign body in esophagus                         causing other injury, initial encounter CPT copyright 2019 American Medical Association. All rights reserved. The codes documented in this report are preliminary and upon coder review may  be revised to meet current compliance requirements. Dr. Ulyess Mort Lin Landsman MD, MD 02/07/2020 11:42:27 AM This  report has been signed electronically. Number of Addenda: 0 Note Initiated On: 02/07/2020 11:10 AM Estimated Blood Loss:  Estimated blood loss: none.      [facility]

## 2020-02-07 NOTE — Anesthesia Preprocedure Evaluation (Signed)
Anesthesia Evaluation  Patient identified by MRN, date of birth, ID band Patient awake    Reviewed: Allergy & Precautions, NPO status , Patient's Chart, lab work & pertinent test results  Airway Mallampati: II       Dental no notable dental hx.    Pulmonary neg pulmonary ROS,    Pulmonary exam normal breath sounds clear to auscultation       Cardiovascular negative cardio ROS Normal cardiovascular exam Rhythm:Regular Rate:Normal     Neuro/Psych negative neurological ROS  negative psych ROS   GI/Hepatic Neg liver ROS, GERD  ,  Endo/Other  negative endocrine ROS  Renal/GU negative Renal ROS  negative genitourinary   Musculoskeletal  (+) Arthritis ,   Abdominal   Peds negative pediatric ROS (+)  Hematology negative hematology ROS (+)   Anesthesia Other Findings .Marland KitchenPast Medical History: No date: Arthritis     Comment:  left hip No date: GERD (gastroesophageal reflux disease)     Comment:  occasional    Reproductive/Obstetrics negative OB ROS                             Anesthesia Physical Anesthesia Plan  ASA: II  Anesthesia Plan: General   Post-op Pain Management:    Induction: Intravenous  PONV Risk Score and Plan: 3 and Propofol infusion  Airway Management Planned: Nasal Cannula  Additional Equipment: None  Intra-op Plan:   Post-operative Plan:   Informed Consent: I have reviewed the patients History and Physical, chart, labs and discussed the procedure including the risks, benefits and alternatives for the proposed anesthesia with the patient or authorized representative who has indicated his/her understanding and acceptance.       Plan Discussed with: CRNA, Anesthesiologist and Surgeon  Anesthesia Plan Comments:         Anesthesia Quick Evaluation

## 2020-02-07 NOTE — Anesthesia Postprocedure Evaluation (Signed)
Anesthesia Post Note  Patient: Shelby Griffith  Procedure(s) Performed: ESOPHAGOGASTRODUODENOSCOPY (EGD) WITH PROPOFOL (N/A )  Patient location during evaluation: Endoscopy Anesthesia Type: General Level of consciousness: awake Pain management: pain level controlled Vital Signs Assessment: post-procedure vital signs reviewed and stable Respiratory status: spontaneous breathing and nonlabored ventilation Cardiovascular status: blood pressure returned to baseline and stable Postop Assessment: no headache and no apparent nausea or vomiting Anesthetic complications: no   No complications documented.   Last Vitals:  Vitals:   02/07/20 1102 02/07/20 1146  BP: 140/65 107/72  Pulse: 71   Resp: 16   Temp: (!) 35.9 C (!) 35.8 C  SpO2: 100%     Last Pain:  Vitals:   02/07/20 1146  TempSrc: Temporal  PainSc: 0-No pain                 Neva Seat

## 2020-02-07 NOTE — Transfer of Care (Signed)
Immediate Anesthesia Transfer of Care Note  Patient: Shelby Griffith  Procedure(s) Performed: ESOPHAGOGASTRODUODENOSCOPY (EGD) WITH PROPOFOL (N/A )  Patient Location: Endoscopy Unit  Anesthesia Type:General  Level of Consciousness: awake  Airway & Oxygen Therapy: Patient Spontanous Breathing  Post-op Assessment: Report given to RN and Post -op Vital signs reviewed and stable  Post vital signs: Reviewed  Last Vitals:  Vitals Value Taken Time  BP    Temp    Pulse 87 02/07/20 1147  Resp 17 02/07/20 1147  SpO2 97 % 02/07/20 1147  Vitals shown include unvalidated device data.  Last Pain:  Vitals:   02/07/20 1102  PainSc: 0-No pain         Complications: No complications documented.

## 2020-02-08 ENCOUNTER — Ambulatory Visit: Payer: Medicare HMO | Admitting: Nurse Practitioner

## 2020-02-08 LAB — SURGICAL PATHOLOGY

## 2020-02-19 ENCOUNTER — Telehealth: Payer: Self-pay

## 2020-02-19 MED ORDER — SUCRALFATE 1 G PO TABS: 1.0000 g | ORAL_TABLET | Freq: Three times a day (TID) | ORAL | 0 refills | Status: DC

## 2020-02-19 NOTE — Telephone Encounter (Signed)
Can you please explained why she needs a medication. Because she states she thought her EGD was normal

## 2020-02-19 NOTE — Telephone Encounter (Signed)
Patient verbalized understanding of instructions and why she needs it. She states she will try the medication

## 2020-02-19 NOTE — Telephone Encounter (Signed)
Patient states that the Carafate suspension is 200 dollars. She states does she need this medication because she thought her EGD was normal. Patient wants to know if she does need it for a short term is there anything cheaper. Patient knows you are off this week and would respond when you review your basket.

## 2020-02-19 NOTE — Telephone Encounter (Signed)
We can try carafate pills   RV

## 2020-02-19 NOTE — Telephone Encounter (Signed)
Just to try because of foreign body sensation she has in her throat. It's ok if she doesn't want to   RV

## 2020-02-20 ENCOUNTER — Telehealth: Payer: Self-pay | Admitting: Internal Medicine

## 2020-02-20 DIAGNOSIS — E041 Nontoxic single thyroid nodule: Secondary | ICD-10-CM

## 2020-02-20 NOTE — Telephone Encounter (Signed)
Pt called in wanted to know about getting a thyroid biopsy and wanted to know about who would do it or what are the next.

## 2020-02-20 NOTE — Telephone Encounter (Signed)
Biopsy ordered.

## 2020-02-20 NOTE — Addendum Note (Signed)
Addended by: Jearld Fenton on: 02/20/2020 03:57 PM   Modules accepted: Orders

## 2020-02-25 NOTE — Telephone Encounter (Signed)
Spoke with patient today. Advised patient that [facility] will call her to set this up and go over details with her on this procedure. Also provided phone number to their department to the patient if she wanted to reach out to them first.

## 2020-03-04 DIAGNOSIS — R69 Illness, unspecified: Secondary | ICD-10-CM | POA: Diagnosis not present

## 2020-03-05 ENCOUNTER — Ambulatory Visit
Admission: RE | Admit: 2020-03-05 | Discharge: 2020-03-05 | Disposition: A | Discharge status: Home / Self Care | Payer: Medicare HMO | Source: Ambulatory Visit | Attending: Internal Medicine | Admitting: Internal Medicine

## 2020-03-05 ENCOUNTER — Other Ambulatory Visit: Payer: Self-pay | Admitting: Internal Medicine

## 2020-03-05 ENCOUNTER — Other Ambulatory Visit (HOSPITAL_COMMUNITY)
Admission: RE | Admit: 2020-03-05 | Discharge: 2020-03-05 | Disposition: A | Discharge status: Home / Self Care | Payer: Medicare HMO | Source: Ambulatory Visit | Attending: Internal Medicine | Admitting: Internal Medicine

## 2020-03-05 DIAGNOSIS — E041 Nontoxic single thyroid nodule: Secondary | ICD-10-CM | POA: Insufficient documentation

## 2020-03-05 DIAGNOSIS — Z1231 Encounter for screening mammogram for malignant neoplasm of breast: Secondary | ICD-10-CM

## 2020-03-06 ENCOUNTER — Other Ambulatory Visit: Payer: Self-pay | Admitting: Internal Medicine

## 2020-03-06 DIAGNOSIS — D497 Neoplasm of unspecified behavior of endocrine glands and other parts of nervous system: Secondary | ICD-10-CM

## 2020-03-06 LAB — CYTOLOGY - NON PAP

## 2020-03-08 DIAGNOSIS — E041 Nontoxic single thyroid nodule: Secondary | ICD-10-CM | POA: Diagnosis not present

## 2020-03-11 ENCOUNTER — Encounter: Payer: Self-pay | Admitting: General Surgery

## 2020-03-11 ENCOUNTER — Other Ambulatory Visit: Payer: Self-pay

## 2020-03-11 ENCOUNTER — Ambulatory Visit (INDEPENDENT_AMBULATORY_CARE_PROVIDER_SITE_OTHER): Payer: Medicare HMO | Admitting: General Surgery

## 2020-03-11 VITALS — BP 144/86 | HR 85 | Temp 98.2°F | Ht 65.0 in | Wt 153.4 lb

## 2020-03-11 DIAGNOSIS — E041 Nontoxic single thyroid nodule: Secondary | ICD-10-CM

## 2020-03-11 NOTE — Patient Instructions (Addendum)
If you have any concerns or questions, please feel to call our office. We will call you to discuss biopsy results.

## 2020-03-12 ENCOUNTER — Other Ambulatory Visit: Payer: Self-pay | Admitting: Gastroenterology

## 2020-03-12 NOTE — Telephone Encounter (Signed)
Last office visit 02/04/20 Foreign body in digestive tract  Last refill 02/19/2020 0 refills

## 2020-03-13 DIAGNOSIS — E041 Nontoxic single thyroid nodule: Secondary | ICD-10-CM | POA: Insufficient documentation

## 2020-03-13 NOTE — Progress Notes (Signed)
Patient ID: Shelby Griffith, female   DOB: Jun 12, 1955, 64 y.o.   MRN: [id number]  Chief Complaint  Patient presents with  . New Patient (Initial Visit)    Follicular neoplasm of thyroid    HPI Shelby Griffith is a 64 y.o. female.  In October of this year, she reports that she ate some salmon and had the sensation of a foreign body/fishbone caught in her throat.  A plain x-ray of the neck did not demonstrate a radiopaque foreign body in the esophagus, but did identify calcified thyroid cartilage, as well as calcification in the carotid arteries.  Further evaluation included fiberoptic laryngoscopy by an otolaryngologist, carotid ultrasound, thyroid ultrasound, and upper endoscopy with gastroenterology.  No foreign body was ever identified, however the thyroid ultrasound demonstrated a TI-RADS 4 nodule that was ultimately biopsied.  The biopsy was performed on December 8 and was consistent with a Hurthle cell neoplasm (Bethesda IV).  Afirma has been collected, but the results are not yet available.  Ms. Hannold has been referred for surgical evaluation and management.  She has a prior history of right thyroid lobectomy 35 years ago for a benign adenoma.  She continues to have a sensation of something stuck on the right side of her throat.  She endorses frequent throat clearing.  No voice changes, frank dysphagia, pressure in her neck while supine.  No heat or cold intolerance.  No heart palpitations or hand tremors.  She says that she has always had dry cracked fingernails, but she does feel like she is losing more hair in the shower.  She denies diarrhea or constipation.  No significant weight loss or gain.  No ocular symptoms.  She has no occupational or therapeutic exposure to radiation and no family history of thyroid cancer or other thyroid disorder.  She does not take any thyroid medication at this time.   Past Medical History:  Diagnosis Date  . Arthritis    left hip  . GERD  (gastroesophageal reflux disease)    occasional     Past Surgical History:  Procedure Laterality Date  . CESAREAN SECTION     x 3  . COLONOSCOPY WITH PROPOFOL N/A 06/14/2017   Procedure: COLONOSCOPY WITH PROPOFOL;  Surgeon: Lin Landsman, MD;  Location: [facility] ENDOSCOPY;  Service: Gastroenterology;  Laterality: N/A;  . ENDOMETRIAL ABLATION    . ESOPHAGOGASTRODUODENOSCOPY (EGD) WITH PROPOFOL N/A 02/07/2020   Procedure: ESOPHAGOGASTRODUODENOSCOPY (EGD) WITH PROPOFOL;  Surgeon: Lin Landsman, MD;  Location: [facility];  Service: Gastroenterology;  Laterality: N/A;  . THYROID LOBECTOMY Right 1986  . thyroid lobectomy Right   . TONSILLECTOMY  1977  . VARICOSE VEIN SURGERY Bilateral     Family History  Problem Relation Age of Onset  . Heart failure Mother   . Lung cancer Paternal Grandfather   . Cancer Sister        Glioblastoma  . Breast cancer Neg Hx   . Esophageal cancer Neg Hx   . Stomach cancer Neg Hx     Social History Social History   Tobacco Use  . Smoking status: Never Smoker  . Smokeless tobacco: Never Used  Vaping Use  . Vaping Use: Never used  Substance Use Topics  . Alcohol use: Yes    Alcohol/week: 9.0 standard drinks    Types: 9 Glasses of wine per week  . Drug use: No    Allergies  Allergen Reactions  . Penicillins Rash    Current Outpatient Medications  Medication Sig Dispense Refill  .  Cholecalciferol (VITAMIN D) 10 MCG/ML LIQD Take 1 drop by mouth daily.    [name] omeprazole (PRILOSEC) 20 MG capsule Take 1 capsule (20 mg total) by mouth 2 (two) times daily before a meal. 60 capsule 0  . sucralfate (CARAFATE) 1 g tablet TAKE 1 TABLET BY MOUTH 4 TIMES A DAY WITH MEALS AND AT BEDTIME 120 tablet 0   No current facility-administered medications for this visit.    Review of Systems Review of Systems  All other systems reviewed and are negative. Or as discussed in the history of present illness.  Blood pressure (!) 144/86, pulse 85,  temperature 98.2 F (36.8 C), temperature source Oral, height 5\' 5"  (1.651 m), weight 153 lb 6.4 oz (69.6 kg), SpO2 98 %. Body mass index is 25.53 kg/m.  Physical Exam Physical Exam Vitals reviewed.  Constitutional:      General: She is not in acute distress.    Appearance: She is normal weight.  HENT:     Head: Normocephalic and atraumatic.     Nose:     Comments: Covered with a mask    Mouth/Throat:     Comments: Covered with a mask Eyes:     General: No scleral icterus.       Right eye: No discharge.        Left eye: No discharge.  Neck:     Comments: She has a low collar Kocher-style incision.  The right lobe of the thyroid is surgically absent.  The left lobe does not have a discretely palpable mass and moves freely with deglutition.  No palpable cervical or supraclavicular lymphadenopathy.  The trachea is midline. Cardiovascular:     Rate and Rhythm: Normal rate and regular rhythm.     Pulses: Normal pulses.  Pulmonary:     Effort: Pulmonary effort is normal.     Breath sounds: Normal breath sounds.  Abdominal:     General: Abdomen is flat. Bowel sounds are normal.     Palpations: Abdomen is soft.  Genitourinary:    Comments: Deferred Musculoskeletal:     Right lower leg: No edema.     Left lower leg: No edema.  Skin:    General: Skin is warm and dry.  Neurological:     General: No focal deficit present.     Mental Status: She is alert and oriented to person, place, and time.  Psychiatric:        Mood and Affect: Mood normal.        Behavior: Behavior normal.     Data Reviewed I reviewed multiple clinic notes in the electronic medical record, beginning with the January 24, 2020 visit with Ms. Garnette Gunner.  At this visit, her chief complaint was a 2-week history of feeling like something was stuck in her throat.  This visit led to additional evaluation including the ultrasound of the thyroid that was done on October 29.  A clinic note from January 31, 2020 was a  visit with otolaryngology where he performed a fiberoptic laryngoscopy and did not appreciate any concerning findings, but he also indicated that he was unable to visualize well beyond the upper esophageal sphincter and recommended referral to gastroenterology.  The patient saw Dr. Marius Ditch in gastroenterology on February 04, 2020.  An EGD was subsequently performed on November 11.  I reviewed the procedure report which did not identify any esophageal foreign body.  A fine-needle aspiration biopsy of the thyroid was performed on December 8.  Cytology was resulted on December  9, with results discussed above.  Afirma has been sent, but this normally takes about 2 weeks to return and is therefore not available at this time.  Results for RUDELL, MARLOWE" (MRN [id number]) as of 03/13/2020 11:12  Ref. Range 11/12/2015 16:38 12/06/2016 12:43 03/14/2018 14:36 11/07/2019 15:18  TSH Latest Ref Range: 0.35 - 4.50 uIU/mL 3.16 3.29    Triiodothyronine,Free,Serum Latest Ref Range: 2.3 - 4.2 pg/mL  3.1    Triiodothyronine (T3) Latest Ref Range: 76 - 181 ng/dL 95.0     T4,Free(Direct) Latest Ref Range: 0.60 - 1.60 ng/dL 0.83 1.09 0.76 0.89  These labs all show normal thyroid function.  I personally reviewed the ultrasound image of the thyroid and concur with the radiology interpretation copied here: CLINICAL DATA:  Status post right-sided thyroidectomy.  EXAM: THYROID ULTRASOUND  TECHNIQUE: Ultrasound examination of the thyroid gland and adjacent soft tissues was performed.  COMPARISON:  None.  FINDINGS: Parenchymal Echotexture: Moderately heterogenous  Isthmus: 0.2 cm  Right lobe: Surgically absent  Left lobe: 4.9 x 2 x 1.6 cm  _________________________________________________________  Estimated total number of nodules >/= 1 cm: 1  Number of spongiform nodules >/=  2 cm not described below (TR1): 0  Number of mixed cystic and solid nodules >/= 1.5 cm not described below  (TR2): 0  _________________________________________________________  Nodule # 2:  Location: Left; Mid  Maximum size: 1.5 cm; Other 2 dimensions: 1.4 x 0.7 cm  Composition: mixed cystic and solid (1)  Echogenicity: isoechoic (1)  Shape: not taller-than-wide (0)  Margins: ill-defined (0)  Echogenic foci: punctate echogenic foci (3) (best visualized on image 30/39 and 31/39).  ACR TI-RADS total points: 5.  ACR TI-RADS risk category: TR4 (4-6 points).  ACR TI-RADS recommendations:  **Given size (>/= 1.5 cm) and appearance, fine needle aspiration of this moderately suspicious nodule should be considered based on TI-RADS criteria.  _________________________________________________________  Nodule # 1:  Location: Left; Superior  Maximum size: 0.8 cm; Other 2 dimensions: 0.7 x 0.5 cm  Composition: solid/almost completely solid (2)  Echogenicity: isoechoic (1)  Shape: not taller-than-wide (0)  Margins: ill-defined (0)  Echogenic foci: macrocalcifications (1)  ACR TI-RADS total points: 4.  ACR TI-RADS risk category: TR4 (4-6 points).  ACR TI-RADS recommendations:  Given size (<0.9 cm) and appearance, this nodule does NOT meet TI-RADS criteria for biopsy or dedicated follow-up.  _________________________________________________________  IMPRESSION: 1. Status post right-sided thyroidectomy without evidence for residual thyroid tissue within the right thyroidectomy bed. 2. There is a 1.5 cm mixed cystic and solid thyroid nodule that demonstrates small punctate echogenic foci and as such is upgraded to a TR 4 thyroid nodule. Fine-needle aspiration is recommended for this thyroid nodule.  The above is in keeping with the ACR TI-RADS recommendations - J Am Coll Radiol 2017;14:587-595.   Electronically Signed   By: Constance Holster M.D.   On: 01/25/2020 18:55  Assessment This is a 64 year old woman with a history of prior  right thyroid lobectomy for a benign adenoma.  She recently underwent imaging to try and delineate the source of her foreign body sensation, which in the process identified a TI-RADS 4 thyroid nodule.  It has been biopsied and is indeterminate (Bethesda IV).  Afirma is currently pending.  Plan I had an extensive conversation with the patient and her daughter.  I explained to them the 6 Bethesda groups and what each of those means, as far as the potential need for surgery.  With the advent of molecular diagnostic testing,  Bethesda III-IV lesions no longer necessarily require diagnostic lobectomy, which historically had been the method of determining whether or not there is capsular or vascular invasion.  I told Ms. Stenseth that if the results of the affirm a testing would not change her opinion and that she would like to proceed with completion thyroidectomy, we could certainly do so; however if she would prefer to avoid completion thyroidectomy with its accompanying risks and need for lifelong thyroid hormone replacement, then we should wait for the Afirma result.  While Afirma lacks some of the diagnostic specificity offered by ThyroSeq, namely specific gene mutations or rearrangements that are more or less associated with the risk of cancer, Afirma does separate lesions into benign or suspicious lesions, with suspicious lesions caring roughly a 50% risk of malignancy and benign lesions having less than 4% risk of malignancy.  The risks of thyroid surgery were discussed, including (but not limited to): bleeding, infection, damage to surrounding structures/tissues, injury (temporary or permanent) to the recurrent laryngeal nerve, hypoparathyroidism (temporary or permanent), need for thyroid hormone replacement therapy, need for additional surgery and/or treatment, recurrence of disease, tracheostomy (temporary or permanent).  After our discussion, both the patient and her daughter felt like they would prefer to  wait for formal results before making further decisions about surgical intervention.  They both had numerous questions, which were answered to their satisfaction.  We will work on obtaining the Afirma results when they are available, after which I will contact the patient and discuss the results with her and the implications as to the need for surgery or lack thereof.   Greater than 50% of this 50-minute visit was spent in counseling and coordination of patient care.     Fredirick Maudlin 03/13/2020, 9:05 AM

## 2020-03-18 ENCOUNTER — Encounter (HOSPITAL_COMMUNITY): Payer: Self-pay

## 2020-03-25 ENCOUNTER — Telehealth: Payer: Self-pay | Admitting: General Surgery

## 2020-03-25 NOTE — Telephone Encounter (Signed)
I discussed the results of the Afirma testing on her indeterminate thyroid nodule.  The genomic sequencing was consistent with a benign nodule, which carries a relative risk of malignancy around 4%.  No surgery is indicated at this time.  I will see her in my office in about 9 months for an in office thyroid ultrasound to monitor the nodule.

## 2020-04-01 ENCOUNTER — Other Ambulatory Visit: Payer: Self-pay

## 2020-04-01 ENCOUNTER — Ambulatory Visit
Admission: RE | Admit: 2020-04-01 | Discharge: 2020-04-01 | Disposition: A | Discharge status: Home / Self Care | Payer: Medicare HMO | Source: Ambulatory Visit | Attending: Internal Medicine | Admitting: Internal Medicine

## 2020-04-01 DIAGNOSIS — Z1231 Encounter for screening mammogram for malignant neoplasm of breast: Secondary | ICD-10-CM | POA: Insufficient documentation

## 2020-06-09 ENCOUNTER — Other Ambulatory Visit: Payer: Self-pay

## 2020-06-09 ENCOUNTER — Telehealth (INDEPENDENT_AMBULATORY_CARE_PROVIDER_SITE_OTHER): Payer: Self-pay | Admitting: Gastroenterology

## 2020-06-09 DIAGNOSIS — Z86010 Personal history of colonic polyps: Secondary | ICD-10-CM

## 2020-06-09 MED ORDER — NA SULFATE-K SULFATE-MG SULF 17.5-3.13-1.6 GM/177ML PO SOLN: 1.0000 | Freq: Once | ORAL | 0 refills | Status: AC

## 2020-06-09 NOTE — Progress Notes (Signed)
Gastroenterology Pre-Procedure Review  Request Date: Thursday 06/19/20 Requesting Physician: Dr. Marius Ditch  PATIENT REVIEW QUESTIONS: The patient responded to the following health history questions as indicated:    1. Are you having any GI issues? yes (pt states she experiences gassiness, and reflux) 2. Do you have a personal history of Polyps? yes (last colonosocpy performed by Dr. Marius Ditch on 06/14/17) 3. Do you have a family history of Colon Cancer or Polyps? yes (mother colon polyps) 4. Diabetes Mellitus? no 5. Joint replacements in the past 12 months?no 6. Major health problems in the past 3 months?02/07/20 EGD due to foreign body performed by Dr. Marius Ditch 7. Any artificial heart valves, MVP, or defibrillator?no    MEDICATIONS & ALLERGIES:    Patient reports the following regarding taking any anticoagulation/antiplatelet therapy:   Plavix, Coumadin, Eliquis, Xarelto, Lovenox, Pradaxa, Brilinta, or Effient? no Aspirin? no  Patient confirms/reports the following medications:  Current Outpatient Medications  Medication Sig Dispense Refill  . Cholecalciferol (VITAMIN D) 10 MCG/ML LIQD Take 1 drop by mouth daily.    . sucralfate (CARAFATE) 1 g tablet TAKE 1 TABLET BY MOUTH 4 TIMES A DAY WITH MEALS AND AT BEDTIME 120 tablet 0   No current facility-administered medications for this visit.    Patient confirms/reports the following allergies:  Allergies  Allergen Reactions  . Penicillins Rash    No orders of the defined types were placed in this encounter.   AUTHORIZATION INFORMATION Primary Insurance: 1D#: Group #:  Secondary Insurance: 1D#: Group #:  SCHEDULE INFORMATION: Date: 06/19/20 Time: Location:[facility]

## 2020-06-11 ENCOUNTER — Encounter: Payer: Self-pay | Admitting: Gastroenterology

## 2020-06-17 ENCOUNTER — Other Ambulatory Visit: Payer: Self-pay

## 2020-06-17 ENCOUNTER — Other Ambulatory Visit
Admission: RE | Admit: 2020-06-17 | Discharge: 2020-06-17 | Disposition: A | Discharge status: Home / Self Care | Payer: Medicare HMO | Source: Ambulatory Visit | Attending: Gastroenterology | Admitting: Gastroenterology

## 2020-06-17 DIAGNOSIS — Z01812 Encounter for preprocedural laboratory examination: Secondary | ICD-10-CM | POA: Insufficient documentation

## 2020-06-17 DIAGNOSIS — Z20822 Contact with and (suspected) exposure to covid-19: Secondary | ICD-10-CM | POA: Insufficient documentation

## 2020-06-17 LAB — SARS CORONAVIRUS 2 (TAT 6-24 HRS): SARS Coronavirus 2: NEGATIVE

## 2020-06-18 NOTE — Discharge Instructions (Signed)

## 2020-06-19 ENCOUNTER — Ambulatory Visit: Payer: Medicare HMO | Admitting: Anesthesiology

## 2020-06-19 ENCOUNTER — Other Ambulatory Visit: Payer: Self-pay

## 2020-06-19 ENCOUNTER — Encounter: Payer: Self-pay | Admitting: Gastroenterology

## 2020-06-19 ENCOUNTER — Ambulatory Visit
Admission: RE | Admit: 2020-06-19 | Discharge: 2020-06-19 | Disposition: A | Discharge status: Home / Self Care | Payer: Medicare HMO | Source: Ambulatory Visit | Attending: Gastroenterology | Admitting: Gastroenterology

## 2020-06-19 ENCOUNTER — Encounter
Admission: RE | Disposition: A | Discharge status: Home / Self Care | Payer: Self-pay | Source: Ambulatory Visit | Attending: Gastroenterology

## 2020-06-19 DIAGNOSIS — Z880 Allergy status to penicillin: Secondary | ICD-10-CM | POA: Diagnosis not present

## 2020-06-19 DIAGNOSIS — D126 Benign neoplasm of colon, unspecified: Secondary | ICD-10-CM | POA: Diagnosis not present

## 2020-06-19 DIAGNOSIS — D120 Benign neoplasm of cecum: Secondary | ICD-10-CM | POA: Diagnosis not present

## 2020-06-19 DIAGNOSIS — Z86010 Personal history of colonic polyps: Secondary | ICD-10-CM | POA: Diagnosis not present

## 2020-06-19 DIAGNOSIS — K635 Polyp of colon: Secondary | ICD-10-CM

## 2020-06-19 DIAGNOSIS — Z79899 Other long term (current) drug therapy: Secondary | ICD-10-CM | POA: Diagnosis not present

## 2020-06-19 DIAGNOSIS — D175 Benign lipomatous neoplasm of intra-abdominal organs: Secondary | ICD-10-CM | POA: Diagnosis not present

## 2020-06-19 DIAGNOSIS — Z1211 Encounter for screening for malignant neoplasm of colon: Secondary | ICD-10-CM | POA: Diagnosis not present

## 2020-06-19 HISTORY — PX: POLYPECTOMY: SHX5525

## 2020-06-19 HISTORY — PX: COLONOSCOPY WITH PROPOFOL: SHX5780

## 2020-06-19 SURGERY — COLONOSCOPY WITH PROPOFOL
Anesthesia: General | Site: Rectum

## 2020-06-19 MED ORDER — ACETAMINOPHEN 160 MG/5ML PO SOLN: 325.0000 mg | ORAL | Status: DC | PRN

## 2020-06-19 MED ORDER — ACETAMINOPHEN 325 MG PO TABS: 325.0000 mg | ORAL_TABLET | ORAL | Status: DC | PRN

## 2020-06-19 MED ORDER — LACTATED RINGERS IV SOLN: INTRAVENOUS | Status: DC | PRN

## 2020-06-19 MED ORDER — SODIUM CHLORIDE 0.9 % IV SOLN: INTRAVENOUS | Status: DC

## 2020-06-19 MED ORDER — ONDANSETRON HCL 4 MG/2ML IJ SOLN: 4.0000 mg | Freq: Once | INTRAMUSCULAR | Status: DC | PRN

## 2020-06-19 MED ORDER — PROPOFOL 10 MG/ML IV BOLUS
INTRAVENOUS | Status: DC | PRN
  Administered 2020-06-19: 40 mg via INTRAVENOUS
  Administered 2020-06-19 (×3): 30 mg via INTRAVENOUS
  Administered 2020-06-19: 20 mg via INTRAVENOUS
  Administered 2020-06-19: 30 mg via INTRAVENOUS
  Administered 2020-06-19: 40 mg via INTRAVENOUS
  Administered 2020-06-19: 30 mg via INTRAVENOUS
  Administered 2020-06-19: 40 mg via INTRAVENOUS
  Administered 2020-06-19: 70 mg via INTRAVENOUS
  Administered 2020-06-19 (×3): 30 mg via INTRAVENOUS
  Administered 2020-06-19 (×2): 20 mg via INTRAVENOUS

## 2020-06-19 MED ORDER — LACTATED RINGERS IV SOLN: INTRAVENOUS | Status: DC

## 2020-06-19 MED ORDER — STERILE WATER FOR IRRIGATION IR SOLN
Status: DC | PRN
  Administered 2020-06-19: .05 mL

## 2020-06-19 MED ORDER — LIDOCAINE HCL (CARDIAC) PF 100 MG/5ML IV SOSY
PREFILLED_SYRINGE | INTRAVENOUS | Status: DC | PRN
  Administered 2020-06-19: 40 mg via INTRAVENOUS

## 2020-06-19 SURGICAL SUPPLY — 9 items
FORCEPS BIOP RAD 4 LRG CAP 4 (CUTTING FORCEPS) IMPLANT
GOWN CVR UNV OPN BCK APRN NK (MISCELLANEOUS) ×4 IMPLANT
GOWN ISOL THUMB LOOP REG UNIV (MISCELLANEOUS) ×6
KIT PRC NS LF DISP ENDO (KITS) ×2 IMPLANT
KIT PROCEDURE OLYMPUS (KITS) ×3
MANIFOLD NEPTUNE II (INSTRUMENTS) ×3 IMPLANT
SNARE COLD EXACTO (MISCELLANEOUS) ×3 IMPLANT
TRAP ETRAP POLY (MISCELLANEOUS) ×3 IMPLANT
WATER STERILE IRR 250ML POUR (IV SOLUTION) ×3 IMPLANT

## 2020-06-19 NOTE — H&P (Signed)
Cephas Darby, MD [address]  [place]  [place], [place] [address]  Main: [phone]  Fax: [phone] Pager: [phone]  Primary Care Physician:  Jearld Fenton, NP Primary Gastroenterologist:  Dr. Cephas Darby  Pre-Procedure History & Physical: HPI:  Shelby Griffith is a 65 y.o. female is here for an colonoscopy.   Past Medical History:  Diagnosis Date  . Arthritis    left hip  . GERD (gastroesophageal reflux disease)    occasional     Past Surgical History:  Procedure Laterality Date  . CESAREAN SECTION     x 3  . COLONOSCOPY WITH PROPOFOL N/A 06/14/2017   Procedure: COLONOSCOPY WITH PROPOFOL;  Surgeon: Lin Landsman, MD;  Location: [facility] ENDOSCOPY;  Service: Gastroenterology;  Laterality: N/A;  . ENDOMETRIAL ABLATION    . ESOPHAGOGASTRODUODENOSCOPY (EGD) WITH PROPOFOL N/A 02/07/2020   Procedure: ESOPHAGOGASTRODUODENOSCOPY (EGD) WITH PROPOFOL;  Surgeon: Lin Landsman, MD;  Location: [place];  Service: Gastroenterology;  Laterality: N/A;  . THYROID LOBECTOMY Right 1986  . thyroid lobectomy Right   . TONSILLECTOMY  1977  . VARICOSE VEIN SURGERY Bilateral     Prior to Admission medications   Medication Sig Start Date End Date Taking? Authorizing Provider  Cholecalciferol (VITAMIN D) 10 MCG/ML LIQD Take 1 drop by mouth daily.   Yes [provider]  famotidine (PEPCID) 20 MG tablet Take 20 mg by mouth daily.   Yes [provider]    Allergies as of 06/09/2020 - Review Complete 06/09/2020  Allergen Reaction Noted  . Penicillins Rash 05/09/2015    Family History  Problem Relation Age of Onset  . Heart failure Mother   . Lung cancer Paternal Grandfather   . Cancer Sister        Glioblastoma  . Breast cancer Neg Hx   . Esophageal cancer Neg Hx   . Stomach cancer Neg Hx     Social History   Socioeconomic History  . Marital status: Married    Spouse name: Not on file  . Number of children: Not on file  .  Years of education: Not on file  . Highest education level: Not on file  Occupational History  . Not on file  Tobacco Use  . Smoking status: Never Smoker  . Smokeless tobacco: Never Used  Vaping Use  . Vaping Use: Never used  Substance and Sexual Activity  . Alcohol use: Yes    Alcohol/week: 9.0 standard drinks    Types: 9 Glasses of wine per week  . Drug use: No  . Sexual activity: Yes    Birth control/protection: Post-menopausal  Other Topics Concern  . Not on file  Social History Narrative  . Not on file   Social Determinants of Health   Financial Resource Strain: Not on file  Food Insecurity: Not on file  Transportation Needs: Not on file  Physical Activity: Not on file  Stress: Not on file  Social Connections: Not on file  Intimate Partner Violence: Not on file    Review of Systems: See HPI, otherwise negative ROS  Physical Exam: BP 125/72   Pulse 76   Temp (!) 97.3 F (36.3 C) (Temporal)   Wt 68.9 kg   SpO2 100%   BMI 25.29 kg/m  General:   Alert,  pleasant and cooperative in NAD Head:  Normocephalic and atraumatic. Neck:  Supple; no masses or thyromegaly. Lungs:  Clear throughout to auscultation.    Heart:  Regular rate and rhythm. Abdomen:  Soft, nontender and nondistended. Normal bowel sounds, without guarding, and without rebound.   Neurologic:  Alert and  oriented x4;  grossly normal neurologically.  Impression/Plan: Shelby Griffith is here for an colonoscopy to be performed for h/o colon polyps  Risks, benefits, limitations, and alternatives regarding  colonoscopy have been reviewed with the patient.  Questions have been answered.  All parties agreeable.   Sherri Sear, MD  06/19/2020, 9:59 AM

## 2020-06-19 NOTE — Transfer of Care (Signed)
Immediate Anesthesia Transfer of Care Note  Patient: Lariyah Shetterly  Procedure(s) Performed: COLONOSCOPY WITH PROPOFOL (N/A Rectum) POLYPECTOMY (Rectum)  Patient Location: PACU  Anesthesia Type: General  Level of Consciousness: awake, alert  and patient cooperative  Airway and Oxygen Therapy: Patient Spontanous Breathing and Patient connected to supplemental oxygen  Post-op Assessment: Post-op Vital signs reviewed, Patient's Cardiovascular Status Stable, Respiratory Function Stable, Patent Airway and No signs of Nausea or vomiting  Post-op Vital Signs: Reviewed and stable  Complications: No complications documented.

## 2020-06-19 NOTE — Op Note (Signed)
[facility] Gastroenterology Patient Name: Shelby Griffith Procedure Date: 06/19/2020 9:48 AM MRN: [id number] Account #: [id number] Date of Birth: [birth year] Admit Type: Outpatient Age: 65 Room: [facility] OR ROOM 01 Gender: Female Note Status: Finalized Procedure:             Colonoscopy Indications:           Surveillance: Personal history of adenomatous polyps                         on last colonoscopy 3 years ago, Last colonoscopy:                         March 2019 Providers:             Lin Landsman MD, MD Referring MD:          Jearld Fenton (Referring MD) Medicines:             General Anesthesia Complications:         No immediate complications. Estimated blood loss: None. Procedure:             Pre-Anesthesia Assessment:                        - Prior to the procedure, a History and Physical was                         performed, and patient medications and allergies were                         reviewed. The patient is competent. The risks and                         benefits of the procedure and the sedation options and                         risks were discussed with the patient. All questions                         were answered and informed consent was obtained.                         Patient identification and proposed procedure were                         verified by the physician, the nurse, the                         anesthesiologist, the anesthetist and the technician                         in the pre-procedure area in the procedure room in the                         endoscopy suite. Mental Status Examination: alert and                         oriented. Airway Examination: normal oropharyngeal  airway and neck mobility. Respiratory Examination:                         clear to auscultation. CV Examination: normal.                         Prophylactic Antibiotics: The patient does not require                          prophylactic antibiotics. Prior Anticoagulants: The                         patient has taken no previous anticoagulant or                         antiplatelet agents. ASA Grade Assessment: II - A                         patient with mild systemic disease. After reviewing                         the risks and benefits, the patient was deemed in                         satisfactory condition to undergo the procedure. The                         anesthesia plan was to use general anesthesia.                         Immediately prior to administration of medications,                         the patient was re-assessed for adequacy to receive                         sedatives. The heart rate, respiratory rate, oxygen                         saturations, blood pressure, adequacy of pulmonary                         ventilation, and response to care were monitored                         throughout the procedure. The physical status of the                         patient was re-assessed after the procedure.                        After obtaining informed consent, the colonoscope was                         passed under direct vision. Throughout the procedure,                         the patient's blood pressure, pulse, and oxygen  saturations were monitored continuously. The was                         introduced through the anus and advanced to the the                         terminal ileum, with identification of the appendiceal                         orifice and IC valve. The colonoscopy was performed                         without difficulty. The patient tolerated the                         procedure well. The quality of the bowel preparation                         was evaluated using the BBPS [facility] Bowel Preparation                         Scale) with scores of: Right Colon = 3, Transverse                         Colon = 3 and Left Colon = 3 (entire mucosa  seen well                         with no residual staining, small fragments of stool or                         opaque liquid). The total BBPS score equals 9. Findings:      The perianal and digital rectal examinations were normal. Pertinent       negatives include normal sphincter tone and no palpable rectal lesions.      A 2 to 5 mm polyp was found in the cecum ileocecal valve. The polyp was       sessile. The polyp was removed with a cold snare. Resection and       retrieval were complete.      A tattoo was seen in the descending colon. A post-polypectomy scar was       found at the tattoo site. There was no evidence of residual polyp tissue.      The retroflexed view of the distal rectum and anal verge was normal and       showed no anal or rectal abnormalities.      The exam was otherwise without abnormality. Impression:            - One 2 to 5 mm polyp in the cecum at the ileocecal                         valve, removed with a cold snare. Resected and                         retrieved.                        - A tattoo was seen in the descending colon.  A                         post-polypectomy scar was found at the tattoo site.                         There was no evidence of residual polyp tissue.                        - The distal rectum and anal verge are normal on                         retroflexion view.                        - The examination was otherwise normal. Recommendation:        - Discharge patient to home (with escort).                        - Resume previous diet today.                        - Continue present medications.                        - Await pathology results.                        - Repeat colonoscopy in 5 years for surveillance. Procedure Code(s):     --- Professional ---                        [phone], Colonoscopy, flexible; with removal of                         tumor(s), polyp(s), or other lesion(s) by snare                          technique Diagnosis Code(s):     --- Professional ---                        Z86.010, Personal history of colonic polyps                        K63.5, Polyp of colon CPT copyright 2019 American Medical Association. All rights reserved. The codes documented in this report are preliminary and upon coder review may  be revised to meet current compliance requirements. Dr. Ulyess Mort Lin Landsman MD, MD 06/19/2020 10:42:47 AM This report has been signed electronically. Number of Addenda: 0 Note Initiated On: 06/19/2020 9:48 AM Scope Withdrawal Time: 0 hours 19 minutes 8 seconds  Total Procedure Duration: 0 hours 24 minutes 22 seconds  Estimated Blood Loss:  Estimated blood loss: none.      [facility]

## 2020-06-19 NOTE — Anesthesia Procedure Notes (Signed)
Performed by: Jennipher Weatherholtz, CRNA Pre-anesthesia Checklist: Patient identified, Emergency Drugs available, Suction available, Timeout performed and Patient being monitored Patient Re-evaluated:Patient Re-evaluated prior to induction Oxygen Delivery Method: Nasal cannula Placement Confirmation: positive ETCO2       

## 2020-06-19 NOTE — Anesthesia Postprocedure Evaluation (Signed)
Anesthesia Post Note  Patient: Shelby Griffith  Procedure(s) Performed: COLONOSCOPY WITH PROPOFOL (N/A Rectum) POLYPECTOMY (Rectum)     Patient location during evaluation: PACU Anesthesia Type: General Level of consciousness: awake Pain management: pain level controlled Vital Signs Assessment: post-procedure vital signs reviewed and stable Respiratory status: respiratory function stable Cardiovascular status: stable Postop Assessment: no signs of nausea or vomiting Anesthetic complications: no   No complications documented.  Veda Canning

## 2020-06-19 NOTE — Anesthesia Preprocedure Evaluation (Signed)
Anesthesia Evaluation  Patient identified by MRN, date of birth, ID band Patient awake    Reviewed: Allergy & Precautions, NPO status   Airway Mallampati: I  TM Distance: >3 FB     Dental   Pulmonary    Pulmonary exam normal        Cardiovascular negative cardio ROS   Rhythm:Regular Rate:Normal     Neuro/Psych    GI/Hepatic GERD  ,  Endo/Other    Renal/GU      Musculoskeletal  (+) Arthritis ,   Abdominal   Peds  Hematology   Anesthesia Other Findings   Reproductive/Obstetrics                             Anesthesia Physical Anesthesia Plan  ASA: II  Anesthesia Plan: General   Post-op Pain Management:    Induction: Intravenous  PONV Risk Score and Plan: Propofol infusion, TIVA and Treatment may vary due to age or medical condition  Airway Management Planned: Natural Airway and Nasal Cannula  Additional Equipment:   Intra-op Plan:   Post-operative Plan:   Informed Consent: I have reviewed the patients History and Physical, chart, labs and discussed the procedure including the risks, benefits and alternatives for the proposed anesthesia with the patient or authorized representative who has indicated his/her understanding and acceptance.       Plan Discussed with: CRNA  Anesthesia Plan Comments:         Anesthesia Quick Evaluation

## 2020-06-20 ENCOUNTER — Encounter: Payer: Self-pay | Admitting: Gastroenterology

## 2020-06-20 LAB — SURGICAL PATHOLOGY

## 2020-08-27 DIAGNOSIS — Z124 Encounter for screening for malignant neoplasm of cervix: Secondary | ICD-10-CM | POA: Diagnosis not present

## 2020-08-27 DIAGNOSIS — Z780 Asymptomatic menopausal state: Secondary | ICD-10-CM | POA: Diagnosis not present

## 2020-09-09 ENCOUNTER — Telehealth: Payer: Self-pay | Admitting: *Deleted

## 2020-09-09 NOTE — Telephone Encounter (Signed)
[facility] - Client TELEPHONE ADVICE RECORD [facility] Patient Name: Shelby Griffith Gender: Female DOB: March 28, 1956 Age: 65 Y 81 M 12 D Return Phone Number: [phone] (Primary) Address: City/ State/ Zip: [place] [address] Client [facility] - Client Client Site [facility] - Day Physician [facility] - PHYSICIAN, Verita Schneiders- MD Contact Type Call Who Is Calling Patient / Member / Family / Caregiver Call Type Triage / Clinical Relationship To Patient Self Return Phone Number [phone] (Primary) Chief Complaint Headache Reason for Call Symptomatic / Request for Health Information Initial Comment Caller has headache. Caller wants appointment Translation No Nurse Assessment Nurse: Ysidro Evert, RN, Levada Dy Date/Time Eilene Ghazi Time): 09/09/2020 1:51:05 PM Confirm and document reason for call. If symptomatic, describe symptoms. ---Caller states she has been having a headache on and off for about a month. It seems to start in the back of her neck and she feels it in the front of her face Does the patient have any new or worsening symptoms? ---Yes Will a triage be completed? ---Yes Related visit to physician within the last 2 weeks? ---No Does the PT have any chronic conditions? (i.e. diabetes, asthma, this includes High risk factors for pregnancy, etc.) ---No Is this a behavioral health or substance abuse call? ---No Guidelines Guideline Title Affirmed Question Affirmed Notes Nurse Date/Time (Eastern Time) Headache [1] New headache AND [2] age > 76 Ysidro Evert, RN, Levada Dy 09/09/2020 1:52:30 PM Disp. Time Eilene Ghazi Time) Disposition Final User 09/09/2020 1:55:52 PM See PCP within 24 Hours Yes Ysidro Evert, RN, Marin Shutter Disagree/Comply Comply Caller Understands Yes PreDisposition Did not know what to do PLEASE NOTE: All timestamps contained within this report are represented as [place] Standard  Time. CONFIDENTIALTY NOTICE: This fax transmission is intended only for the addressee. It contains information that is legally privileged, confidential or otherwise protected from use or disclosure. If you are not the intended recipient, you are strictly prohibited from reviewing, disclosing, copying using or disseminating any of this information or taking any action in reliance on or regarding this information. If you have received this fax in error, please notify us immediately by telephone so that we can arrange for its return to [place]. Phone: [phone], Toll-Free: [phone], Fax: [phone] Page: 2 of 2 Call Id: [id number] Care Advice Given Per Guideline SEE PCP WITHIN 24 HOURS: REST: * Lie down in a dark quiet place and relax until feeling better. LOCAL COLD: * Apply a cold wet washcloth or cold pack to the forehead for 20 minutes. CARE ADVICE given per Headache (Adult) guideline. CALL BACK IF: * Blurred vision or double vision occurs * Numbness or weakness of the face, arm or leg * Difficulty with speaking * You become worse Referrals REFERRED TO PCP OFFICE

## 2020-09-09 NOTE — Telephone Encounter (Signed)
Pt called back and said that access nurse told her to be seen within 24hr, appt scheduled with Dr. Silvio Pate tomorrow at 11:15  FYI to Dr. Silvio Pate

## 2020-09-09 NOTE — Telephone Encounter (Signed)
Pt called in wanting an appt, she has been having bad head pain/neck pain, pt sent to access nurse for triage before scheduling an appt

## 2020-09-09 NOTE — Telephone Encounter (Signed)
Per appt note pt has in office appt to see Dr Silvio Pate on 09/10/20 at 11:15.

## 2020-09-10 ENCOUNTER — Ambulatory Visit (INDEPENDENT_AMBULATORY_CARE_PROVIDER_SITE_OTHER): Payer: Medicare HMO | Admitting: Internal Medicine

## 2020-09-10 ENCOUNTER — Encounter: Payer: Self-pay | Admitting: Internal Medicine

## 2020-09-10 ENCOUNTER — Telehealth: Payer: Self-pay

## 2020-09-10 ENCOUNTER — Other Ambulatory Visit: Payer: Self-pay

## 2020-09-10 DIAGNOSIS — R519 Headache, unspecified: Secondary | ICD-10-CM | POA: Diagnosis not present

## 2020-09-10 NOTE — Patient Instructions (Signed)
Please try over the counter topical diclofenac gel on the painful spot 2-3 times a day. You can also try heat. If it doesn't improve, we can consider physical therapy or chiropractic

## 2020-09-10 NOTE — Assessment & Plan Note (Signed)
Reassured--seems to be muscular Does get better with ibuprofen--will try topical diclofenac and heat Consider PT or chiropractic Discussed optimal neck positioning

## 2020-09-10 NOTE — Telephone Encounter (Signed)
Pt will be staying with [facility]. Provided info on Tabitha's estimated start date of September.

## 2020-09-10 NOTE — Progress Notes (Signed)
Subjective:    Patient ID: Shelby Griffith, female    DOB: 1956-02-14, 65 y.o.   MRN: [id number]  HPI Here due to neck pain and headache This visit occurred during the SARS-CoV-2 public health emergency.  Safety protocols were in place, including screening questions prior to the visit, additional usage of staff PPE, and extensive cleaning of exam room while observing appropriate contact time as indicated for disinfecting solutions.   Started about a month ago Starts on right occiput and moves up scalp slightly (mostly posterior) Will bother her daily--enough to try some advil (400mg  --which helps) No dizziness No vision changes  Retired [employer] time on computer --usually on lap Same pillow--does feel it at night  No known injury Had done new tasks  Current Outpatient Medications on File Prior to Visit  Medication Sig Dispense Refill   Cholecalciferol (VITAMIN D) 10 MCG/ML LIQD Take 1 drop by mouth daily.     famotidine (PEPCID) 20 MG tablet Take 20 mg by mouth daily.     No current facility-administered medications on file prior to visit.    Allergies  Allergen Reactions   Penicillins Rash    Past Medical History:  Diagnosis Date   Arthritis    left hip   GERD (gastroesophageal reflux disease)    occasional     Past Surgical History:  Procedure Laterality Date   CESAREAN SECTION     x 3   COLONOSCOPY WITH PROPOFOL N/A 06/14/2017   Procedure: COLONOSCOPY WITH PROPOFOL;  Surgeon: Lin Landsman, MD;  Location: [facility] ENDOSCOPY;  Service: Gastroenterology;  Laterality: N/A;   COLONOSCOPY WITH PROPOFOL N/A 06/19/2020   Procedure: COLONOSCOPY WITH PROPOFOL;  Surgeon: Lin Landsman, MD;  Location: [place];  Service: Endoscopy;  Laterality: N/A;   ENDOMETRIAL ABLATION     ESOPHAGOGASTRODUODENOSCOPY (EGD) WITH PROPOFOL N/A 02/07/2020   Procedure: ESOPHAGOGASTRODUODENOSCOPY (EGD) WITH PROPOFOL;  Surgeon: Lin Landsman, MD;  Location: [facility]  ENDOSCOPY;  Service: Gastroenterology;  Laterality: N/A;   POLYPECTOMY  06/19/2020   Procedure: POLYPECTOMY;  Surgeon: Lin Landsman, MD;  Location: [place];  Service: Endoscopy;;   THYROID LOBECTOMY Right 1986   thyroid lobectomy Right    TONSILLECTOMY  1977   VARICOSE VEIN SURGERY Bilateral     Family History  Problem Relation Age of Onset   Heart failure Mother    Lung cancer Paternal Grandfather    Cancer Sister        Glioblastoma   Breast cancer Neg Hx    Esophageal cancer Neg Hx    Stomach cancer Neg Hx     Social History   Socioeconomic History   Marital status: Married    Spouse name: Not on file   Number of children: Not on file   Years of education: Not on file   Highest education level: Not on file  Occupational History   Not on file  Tobacco Use   Smoking status: Never   Smokeless tobacco: Never  Vaping Use   Vaping Use: Never used  Substance and Sexual Activity   Alcohol use: Yes    Alcohol/week: 9.0 standard drinks    Types: 9 Glasses of wine per week   Drug use: No   Sexual activity: Yes    Birth control/protection: Post-menopausal  Other Topics Concern   Not on file  Social History Narrative   Not on file   Social Determinants of Health   Financial Resource Strain: Not on file  Food Insecurity:  Not on file  Transportation Needs: Not on file  Physical Activity: Not on file  Stress: Not on file  Social Connections: Not on file  Intimate Partner Violence: Not on file   Review of Systems No fevers Worries her due to sister having glioblasma Appetite is fine Weight stable  Sleeps okay No focal weakness, aphasia, facial droop, etc    Objective:   Physical Exam Neck:     Comments: Normal ROM but some discomfort when tilting to the left (on right side) Localized tenderness at right occipital ridge (and not higher) and down along muscle No masses or nodes          Assessment & Plan:

## 2020-09-10 NOTE — Telephone Encounter (Signed)
Okay I will assess at her OV today

## 2020-12-19 ENCOUNTER — Encounter: Payer: Medicare HMO | Admitting: Nurse Practitioner

## 2020-12-25 ENCOUNTER — Encounter: Payer: Self-pay | Admitting: General Surgery

## 2020-12-25 ENCOUNTER — Encounter: Payer: Self-pay | Admitting: Nurse Practitioner

## 2020-12-25 ENCOUNTER — Ambulatory Visit: Payer: Medicare HMO | Admitting: General Surgery

## 2020-12-25 ENCOUNTER — Other Ambulatory Visit: Payer: Self-pay

## 2020-12-25 ENCOUNTER — Ambulatory Visit (INDEPENDENT_AMBULATORY_CARE_PROVIDER_SITE_OTHER): Payer: Medicare HMO | Admitting: Nurse Practitioner

## 2020-12-25 ENCOUNTER — Ambulatory Visit: Payer: Self-pay

## 2020-12-25 VITALS — BP 116/64 | HR 66 | Temp 98.3°F | Ht 65.0 in | Wt 157.0 lb

## 2020-12-25 VITALS — BP 130/82 | HR 65 | Temp 97.6°F | Resp 10 | Ht 65.25 in | Wt 157.0 lb

## 2020-12-25 DIAGNOSIS — E559 Vitamin D deficiency, unspecified: Secondary | ICD-10-CM

## 2020-12-25 DIAGNOSIS — K219 Gastro-esophageal reflux disease without esophagitis: Secondary | ICD-10-CM

## 2020-12-25 DIAGNOSIS — R519 Headache, unspecified: Secondary | ICD-10-CM

## 2020-12-25 DIAGNOSIS — Z0000 Encounter for general adult medical examination without abnormal findings: Secondary | ICD-10-CM

## 2020-12-25 DIAGNOSIS — E041 Nontoxic single thyroid nodule: Secondary | ICD-10-CM | POA: Diagnosis not present

## 2020-12-25 DIAGNOSIS — Z23 Encounter for immunization: Secondary | ICD-10-CM | POA: Diagnosis not present

## 2020-12-25 LAB — COMPREHENSIVE METABOLIC PANEL
ALT: 12 U/L (ref 0–35)
AST: 21 U/L (ref 0–37)
Albumin: 4.9 g/dL (ref 3.5–5.2)
Alkaline Phosphatase: 66 U/L (ref 39–117)
BUN: 15 mg/dL (ref 6–23)
CO2: 29 mEq/L (ref 19–32)
Calcium: 9.5 mg/dL (ref 8.4–10.5)
Chloride: 102 mEq/L (ref 96–112)
Creatinine, Ser: 0.67 mg/dL (ref 0.40–1.20)
GFR: 91.25 mL/min (ref >60.00)
Glucose, Bld: 89 mg/dL (ref 70–99)
Potassium: 3.8 mEq/L (ref 3.5–5.1)
Sodium: 139 mEq/L (ref 135–145)
Total Bilirubin: 0.5 mg/dL (ref 0.2–1.2)
Total Protein: 7.2 g/dL (ref 6.0–8.3)

## 2020-12-25 LAB — CBC
HCT: 39.9 % (ref 36.0–46.0)
Hemoglobin: 13.2 g/dL (ref 12.0–15.0)
MCHC: 33 g/dL (ref 30.0–36.0)
MCV: 96.5 fl (ref 78.0–100.0)
Platelets: 229 10*3/uL (ref 150.0–400.0)
RBC: 4.13 Mil/uL (ref 3.87–5.11)
RDW: 12.6 % (ref 11.5–15.5)
WBC: 5.7 10*3/uL (ref 4.0–10.5)

## 2020-12-25 LAB — LIPID PANEL
Cholesterol: 232 mg/dL — ABNORMAL HIGH (ref 0–200)
HDL: 80.4 mg/dL (ref >39.00)
LDL Cholesterol: 134 mg/dL — ABNORMAL HIGH (ref 0–99)
NonHDL: 151.97
Total CHOL/HDL Ratio: 3
Triglycerides: 92 mg/dL (ref 0.0–149.0)
VLDL: 18.4 mg/dL (ref 0.0–40.0)

## 2020-12-25 LAB — TSH: TSH: 3.91 u[IU]/mL (ref 0.35–5.50)

## 2020-12-25 LAB — VITAMIN D 25 HYDROXY (VIT D DEFICIENCY, FRACTURES): VITD: 36.52 ng/mL (ref 30.00–100.00)

## 2020-12-25 LAB — T4, FREE: Free T4: 0.8 ng/dL (ref 0.60–1.60)

## 2020-12-25 NOTE — Assessment & Plan Note (Signed)
Found to have nodules on thyroid underwent FNA that was positive patient had a partial thyroidectomy.  Did see general surgeon today for repeat ultrasound.  Per patient report ultrasound was negative for changes and recommended a follow-up in 1 year.  Will follow-up in 1 year with thyroid ultrasound.

## 2020-12-25 NOTE — Patient Instructions (Signed)
You will need a follow up thyroid ultrasound in one year.  You may have this done through your Primary Care Provider.   Follow-up with our office as needed.  Please call and ask to speak with a nurse if you develop questions or concerns.

## 2020-12-25 NOTE — Assessment & Plan Note (Signed)
Has been evaluated for this in the past previous sounds very musculoskeletal related to me.  Did advise patient we could try muscle relaxant.  Patient wants to avoid pharmacological intervention if possible.  Did discuss that she could try massage if not we could always pursue dry needling to help with the muscle tightness and tenderness.  We will continue to monitor.

## 2020-12-25 NOTE — Progress Notes (Signed)
Established Patient Office Visit  Subjective:  Patient ID: Shelby Griffith, female    DOB: 08/25/1955  Age: 65 y.o. MRN: [id number]  CC:  Chief Complaint  Patient presents with   Transfer of Care    HPI Ulanda Tackett present for complete physical and follow up of chronic conditions.  Immunizations: -Tetanus: defer until first of the year -Influenza: today -Covid-19: pfizer x2 and a booster -Shingles: defer until first of the year -Pneumonia: defer until first of the year  -HPV: NA  Diet: Fair diet. Eat twice daily some snakcing Exercise: No regular exercise. Walks 3-5 weekly 19mins min  Eye exam:  needs updating Dental exam: Completes semi-annually   Pap Smear: Completed in 2022 Mammogram: Completed in 2022 Dexa: Completed in 12/17/2015 Colonoscopy: Completed in 2022  Lung Cancer Screening: Social smoker as a kid    GERD: followed by GI  Thyroid Nodule: Was followed by General surgery. Was seen today and they did a repeat US. Will need a repeat US in 1 year 11/2021.  Past Medical History:  Diagnosis Date   Arthritis    left hip   GERD (gastroesophageal reflux disease)    occasional     Past Surgical History:  Procedure Laterality Date   CESAREAN SECTION     x 3   COLONOSCOPY WITH PROPOFOL N/A 06/14/2017   Procedure: COLONOSCOPY WITH PROPOFOL;  Surgeon: Lin Landsman, MD;  Location: [facility] ENDOSCOPY;  Service: Gastroenterology;  Laterality: N/A;   COLONOSCOPY WITH PROPOFOL N/A 06/19/2020   Procedure: COLONOSCOPY WITH PROPOFOL;  Surgeon: Lin Landsman, MD;  Location: [place];  Service: Endoscopy;  Laterality: N/A;   ENDOMETRIAL ABLATION     ESOPHAGOGASTRODUODENOSCOPY (EGD) WITH PROPOFOL N/A 02/07/2020   Procedure: ESOPHAGOGASTRODUODENOSCOPY (EGD) WITH PROPOFOL;  Surgeon: Lin Landsman, MD;  Location: [facility] ENDOSCOPY;  Service: Gastroenterology;  Laterality: N/A;   POLYPECTOMY  06/19/2020   Procedure: POLYPECTOMY;  Surgeon:  Lin Landsman, MD;  Location: [place];  Service: Endoscopy;;   THYROID LOBECTOMY Right 1986   thyroid lobectomy Right    TONSILLECTOMY  1977   VARICOSE VEIN SURGERY Bilateral     Family History  Problem Relation Age of Onset   Heart failure Mother    Lung cancer Paternal Grandfather    Cancer Sister        Glioblastoma   Breast cancer Neg Hx    Esophageal cancer Neg Hx    Stomach cancer Neg Hx     Social History   Socioeconomic History   Marital status: Married    Spouse name: Not on file   Number of children: Not on file   Years of education: Not on file   Highest education level: Not on file  Occupational History   Not on file  Tobacco Use   Smoking status: Never   Smokeless tobacco: Never  Vaping Use   Vaping Use: Never used  Substance and Sexual Activity   Alcohol use: Yes    Alcohol/week: 9.0 standard drinks    Types: 9 Glasses of wine per week   Drug use: No   Sexual activity: Yes    Birth control/protection: Post-menopausal  Other Topics Concern   Not on file  Social History Narrative   Not on file   Social Determinants of Health   Financial Resource Strain: Not on file  Food Insecurity: Not on file  Transportation Needs: Not on file  Physical Activity: Not on file  Stress: Not on file  Social  Connections: Not on file  Intimate Partner Violence: Not on file    Outpatient Medications Prior to Visit  Medication Sig Dispense Refill   Digestive Enzymes (DIGESTIVE ENZYME PO) Take by mouth.     OVER THE COUNTER MEDICATION Tums or Prilosec OTC as needed     Probiotic Product (PROBIOTIC-10 PO) Take by mouth.     Cholecalciferol (VITAMIN D) 10 MCG/ML LIQD Take 1 drop by mouth daily. (Patient not taking: Reported on 12/25/2020)     famotidine (PEPCID) 20 MG tablet Take 20 mg by mouth daily.     No facility-administered medications prior to visit.    Allergies  Allergen Reactions   Penicillins Rash    ROS Review of Systems   Constitutional:  Negative for chills, fatigue and fever.  Respiratory:  Negative for cough and shortness of breath.   Cardiovascular:  Negative for chest pain, palpitations and leg swelling.  Gastrointestinal:  Negative for blood in stool, diarrhea, nausea and vomiting.  Genitourinary:  Negative for dysuria, hematuria, vaginal bleeding, vaginal discharge and vaginal pain.  Neurological:  Negative for dizziness, weakness, light-headedness, numbness and headaches.     Objective:    Physical Exam Vitals and nursing note reviewed.  Constitutional:      Appearance: Normal appearance.  HENT:     Right Ear: Tympanic membrane, ear canal and external ear normal. There is no impacted cerumen.     Left Ear: Tympanic membrane, ear canal and external ear normal. There is no impacted cerumen.     Mouth/Throat:     Mouth: Mucous membranes are moist.     Pharynx: Oropharynx is clear.  Eyes:     Extraocular Movements: Extraocular movements intact.     Pupils: Pupils are equal, round, and reactive to light.  Neck:     Thyroid: No thyroid mass, thyromegaly or thyroid tenderness.     Comments: Scar from previous partial thyroidectomy  Cardiovascular:     Rate and Rhythm: Normal rate and regular rhythm.  Pulmonary:     Effort: Pulmonary effort is normal.     Breath sounds: Normal breath sounds.  Abdominal:     General: Bowel sounds are normal. There is no distension.     Palpations: There is no mass.     Tenderness: There is no abdominal tenderness.  Musculoskeletal:     Right lower leg: No edema.     Left lower leg: No edema.     Comments: TTP on right Trap muscle and some TTP on right paraspinal muscles  Lymphadenopathy:     Cervical: No cervical adenopathy.  Skin:    General: Skin is warm.  Neurological:     Mental Status: She is alert.     Motor: No weakness.     Coordination: Coordination normal.     Deep Tendon Reflexes: Reflexes normal.  Psychiatric:        Mood and Affect: Mood  normal.        Behavior: Behavior normal.        Thought Content: Thought content normal.        Judgment: Judgment normal.    BP 130/82   Pulse 65   Temp 97.6 F (36.4 C)   Resp 10   Ht 5' 5.25" (1.657 m)   Wt 157 lb (71.2 kg)   SpO2 96%   BMI 25.93 kg/m  Wt Readings from Last 3 Encounters:  12/25/20 157 lb (71.2 kg)  12/25/20 157 lb (71.2 kg)  09/10/20 154 lb (69.9  kg)     Health Maintenance Due  Topic Date Due   COVID-19 Vaccine (1) Never done   Zoster Vaccines- Shingrix (1 of 2) Never done   TETANUS/TDAP  03/06/2019   INFLUENZA VACCINE  10/27/2020    There are no preventive care reminders to display for this patient.  Lab Results  Component Value Date   TSH 3.29 12/06/2016   Lab Results  Component Value Date   WBC 5.2 11/07/2019   HGB 13.3 11/07/2019   HCT 39.6 11/07/2019   MCV 95.8 11/07/2019   PLT 210.0 11/07/2019   Lab Results  Component Value Date   NA 139 11/07/2019   K 4.3 11/07/2019   CO2 29 11/07/2019   GLUCOSE 98 11/07/2019   BUN 10 11/07/2019   CREATININE 0.72 11/07/2019   BILITOT 0.7 11/07/2019   ALKPHOS 68 11/07/2019   AST 21 11/07/2019   ALT 12 11/07/2019   PROT 7.1 11/07/2019   ALBUMIN 4.7 11/07/2019   CALCIUM 9.9 11/07/2019   GFR 81.29 11/07/2019   Lab Results  Component Value Date   CHOL 248 (H) 11/07/2019   Lab Results  Component Value Date   HDL 79.10 11/07/2019   Lab Results  Component Value Date   LDLCALC 156 (H) 11/07/2019   Lab Results  Component Value Date   TRIG 65.0 11/07/2019   Lab Results  Component Value Date   CHOLHDL 3 11/07/2019   No results found for: HGBA1C    Assessment & Plan:   Problem List Items Addressed This Visit       Digestive   GERD (gastroesophageal reflux disease)    Currently managed with over-the-counter treatments and by GI.  Continue current regimen      Relevant Medications   Probiotic Product (PROBIOTIC-10 PO)   Digestive Enzymes (DIGESTIVE ENZYME PO)     Endocrine    Thyroid nodule    Found to have nodules on thyroid underwent FNA that was positive patient had a partial thyroidectomy.  Did see general surgeon today for repeat ultrasound.  Per patient report ultrasound was negative for changes and recommended a follow-up in 1 year.  Will follow-up in 1 year with thyroid ultrasound.      Relevant Orders   T4, free (Completed)   TSH (Completed)   T3     Other   Nonintractable headache    Has been evaluated for this in the past previous sounds very musculoskeletal related to me.  Did advise patient we could try muscle relaxant.  Patient wants to avoid pharmacological intervention if possible.  Did discuss that she could try massage if not we could always pursue dry needling to help with the muscle tightness and tenderness.  We will continue to monitor.      Vitamin D deficiency    Only maintained on over-the-counter vitamin D supplementation.  Check levels today and labs, pending lab results.      Relevant Orders   VITAMIN D 25 Hydroxy (Vit-D Deficiency, Fractures) (Completed)   Preventative health care - Primary   Relevant Orders   CBC (Completed)   Comprehensive metabolic panel (Completed)   Lipid panel (Completed)   DG Bone Density   Other Visit Diagnoses     Need for influenza vaccination       Relevant Orders   Flu Vaccine QUAD 6+ mos PF IM (Fluarix Quad PF) (Completed)       No orders of the defined types were placed in this encounter.   Follow-up: Return  in about 1 year (around 12/25/2021).   This visit occurred during the SARS-CoV-2 public health emergency.  Safety protocols were in place, including screening questions prior to the visit, additional usage of staff PPE, and extensive cleaning of exam room while observing appropriate contact time as indicated for disinfecting solutions.   Romilda Garret, NP

## 2020-12-25 NOTE — Assessment & Plan Note (Signed)
Currently managed with over-the-counter treatments and by GI.  Continue current regimen

## 2020-12-25 NOTE — Assessment & Plan Note (Signed)
Only maintained on over-the-counter vitamin D supplementation.  Check levels today and labs, pending lab results.

## 2020-12-25 NOTE — Patient Instructions (Signed)
Nice to see you Will be in touch regarding labs Follow up 1 year, sooner if needed

## 2020-12-26 ENCOUNTER — Encounter: Payer: Self-pay | Admitting: Nurse Practitioner

## 2020-12-29 ENCOUNTER — Other Ambulatory Visit: Payer: Self-pay | Admitting: Nurse Practitioner

## 2020-12-29 DIAGNOSIS — Z0000 Encounter for general adult medical examination without abnormal findings: Secondary | ICD-10-CM

## 2020-12-29 NOTE — Progress Notes (Signed)
Here for f/u of thyroid nodule. No symptoms of hyper/hypothyroidism, nor any compressive symptoms.  Stable on ultrasound in clinic today.  F/u US in one year recommended, however I am no longer with [facility]. OK for PCP to follow at this time.

## 2021-01-13 ENCOUNTER — Encounter: Payer: Self-pay | Admitting: General Surgery

## 2021-02-03 ENCOUNTER — Ambulatory Visit: Payer: Medicare HMO | Admitting: Cardiovascular Disease

## 2021-02-03 ENCOUNTER — Other Ambulatory Visit: Payer: Self-pay

## 2021-02-03 NOTE — Progress Notes (Deleted)
Cardiology Office Note  Date:  02/03/2021   ID:  Shelby Griffith, DOB [birth year], MRN [id number]  PCP:  Michela Pitcher, NP   No chief complaint on file.   HPI:     History of pericardial effusion  Carotid ultrasound October 2021, heterogeneous plaque without significant calcification on right, heterogeneous plaque on the left without significant calcification  Total cholesterol 232, LDL 134  PMH:   has a past medical history of Arthritis and GERD (gastroesophageal reflux disease).  PSH:    Past Surgical History:  Procedure Laterality Date   CESAREAN SECTION     x 3   COLONOSCOPY WITH PROPOFOL N/A 06/14/2017   Procedure: COLONOSCOPY WITH PROPOFOL;  Surgeon: Lin Landsman, MD;  Location: [facility] ENDOSCOPY;  Service: Gastroenterology;  Laterality: N/A;   COLONOSCOPY WITH PROPOFOL N/A 06/19/2020   Procedure: COLONOSCOPY WITH PROPOFOL;  Surgeon: Lin Landsman, MD;  Location: [place];  Service: Endoscopy;  Laterality: N/A;   ENDOMETRIAL ABLATION     ESOPHAGOGASTRODUODENOSCOPY (EGD) WITH PROPOFOL N/A 02/07/2020   Procedure: ESOPHAGOGASTRODUODENOSCOPY (EGD) WITH PROPOFOL;  Surgeon: Lin Landsman, MD;  Location[facility] ENDOSCOPY;  Service: Gastroenterology;  Laterality: N/A;   POLYPECTOMY  06/19/2020   Procedure: POLYPECTOMY;  Surgeon: Lin Landsman, MD;  Location: [place];  Service: Endoscopy;;   THYROID LOBECTOMY Right 1986   thyroid lobectomy Right    TONSILLECTOMY  1977   VARICOSE VEIN SURGERY Bilateral     Current Outpatient Medications  Medication Sig Dispense Refill   Cholecalciferol (VITAMIN D) 10 MCG/ML LIQD Take 1 drop by mouth daily. (Patient not taking: Reported on 12/25/2020)     Digestive Enzymes (DIGESTIVE ENZYME PO) Take by mouth.     OVER THE COUNTER MEDICATION Tums or Prilosec OTC as needed     Probiotic Product (PROBIOTIC-10 PO) Take by mouth.     No current facility-administered medications for this visit.      Allergies:   Penicillins   Social History:  The patient  reports that she has never smoked. She has never used smokeless tobacco. She reports current alcohol use of about 9.0 standard drinks per week. She reports that she does not use drugs.   Family History:   family history includes Cancer in her sister; Heart failure in her mother; Lung cancer in her paternal grandfather.    Review of Systems: ROS   PHYSICAL EXAM: VS:  There were no vitals taken for this visit. , BMI There is no height or weight on file to calculate BMI. GEN: Well nourished, well developed, in no acute distress HEENT: normal Neck: no JVD, carotid bruits, or masses Cardiac: RRR; no murmurs, rubs, or gallops,no edema  Respiratory:  clear to auscultation bilaterally, normal work of breathing GI: soft, nontender, nondistended, + BS MS: no deformity or atrophy Skin: warm and dry, no rash Neuro:  Strength and sensation are intact Psych: euthymic mood, full affect    Recent Labs: 12/25/2020: ALT 12; BUN 15; Creatinine, Ser 0.67; Hemoglobin 13.2; Platelets 229.0; Potassium 3.8; Sodium 139; TSH 3.91    Lipid Panel Lab Results  Component Value Date   CHOL 232 (H) 12/25/2020   HDL 80.40 12/25/2020   LDLCALC 134 (H) 12/25/2020   TRIG 92.0 12/25/2020      Wt Readings from Last 3 Encounters:  12/25/20 157 lb (71.2 kg)  12/25/20 157 lb (71.2 kg)  09/10/20 154 lb (69.9 kg)       ASSESSMENT AND PLAN:  Problem List Items Addressed  This Visit   None    Disposition:   F/U  12 months   Total encounter time more than 25 minutes  Greater than 50% was spent in counseling and coordination of care with the patient    Signed, Esmond Plants, M.D., Ph.D. [place], [place]

## 2021-02-11 ENCOUNTER — Ambulatory Visit
Admission: RE | Admit: 2021-02-11 | Discharge: 2021-02-11 | Disposition: A | Discharge status: Home / Self Care | Payer: Medicare HMO | Source: Ambulatory Visit | Attending: Nurse Practitioner | Admitting: Nurse Practitioner

## 2021-02-11 ENCOUNTER — Other Ambulatory Visit: Payer: Self-pay

## 2021-02-11 DIAGNOSIS — Z780 Asymptomatic menopausal state: Secondary | ICD-10-CM | POA: Diagnosis not present

## 2021-02-11 DIAGNOSIS — M85851 Other specified disorders of bone density and structure, right thigh: Secondary | ICD-10-CM | POA: Diagnosis not present

## 2021-02-11 DIAGNOSIS — Z13820 Encounter for screening for osteoporosis: Secondary | ICD-10-CM | POA: Insufficient documentation

## 2021-02-11 DIAGNOSIS — Z0000 Encounter for general adult medical examination without abnormal findings: Secondary | ICD-10-CM | POA: Insufficient documentation

## 2021-02-17 ENCOUNTER — Other Ambulatory Visit: Payer: Self-pay | Admitting: Nurse Practitioner

## 2021-02-17 NOTE — Progress Notes (Unsigned)
lci

## 2021-02-24 ENCOUNTER — Ambulatory Visit: Payer: Medicare HMO | Admitting: Cardiovascular Disease

## 2021-03-16 ENCOUNTER — Ambulatory Visit: Payer: Medicare HMO | Admitting: Cardiovascular Disease

## 2021-03-31 ENCOUNTER — Ambulatory Visit: Payer: Medicare HMO | Admitting: Cardiovascular Disease

## 2021-03-31 NOTE — Progress Notes (Deleted)
Cardiology Office Note  Date:  03/31/2021   ID:  Shelby Griffith, DOB 1955-09-06, MRN [id number]  PCP:  Michela Pitcher, NP   No chief complaint on file.   HPI:  [name] is a 66 year old woman with past medical history of Thyroid nodule, GERD, headaches Hyperlipidemia Recently moved to the area, like to be seen by cardiology for preventive health   Notes from primary care reviewed Diet: Fair diet. Eat twice daily some snakcing Exercise: No regular exercise. Walks 3-5 weekly 57mins min Social smoker when younger  Carotid ultrasound: No significant calcification on right Heterogeneous plaque at the left carotid bifurcation without significant calcifications    Lab Results  Component Value Date   CHOL 232 (H) 12/25/2020   HDL 80.40 12/25/2020   [place] 134 (H) 12/25/2020   TRIG 92.0 12/25/2020     PMH:   has a past medical history of Arthritis and GERD (gastroesophageal reflux disease).  PSH:    Past Surgical History:  Procedure Laterality Date   CESAREAN SECTION     x 3   COLONOSCOPY WITH PROPOFOL N/A 06/14/2017   Procedure: COLONOSCOPY WITH PROPOFOL;  Surgeon: Lin Landsman, MD;  Location: [facility] ENDOSCOPY;  Service: Gastroenterology;  Laterality: N/A;   COLONOSCOPY WITH PROPOFOL N/A 06/19/2020   Procedure: COLONOSCOPY WITH PROPOFOL;  Surgeon: Lin Landsman, MD;  Location: [place];  Service: Endoscopy;  Laterality: N/A;   ENDOMETRIAL ABLATION     ESOPHAGOGASTRODUODENOSCOPY (EGD) WITH PROPOFOL N/A 02/07/2020   Procedure: ESOPHAGOGASTRODUODENOSCOPY (EGD) WITH PROPOFOL;  Surgeon: Lin Landsman, MD;  [facility]COPY;  Service: Gastroenterology;  Laterality: N/A;   POLYPECTOMY  06/19/2020   Procedure: POLYPECTOMY;  Surgeon: Lin Landsman, MD;  Location: [place];  Service: Endoscopy;;   THYROID LOBECTOMY Right 1986   thyroid lobectomy Right    TONSILLECTOMY  1977   VARICOSE VEIN SURGERY Bilateral      Current Outpatient Medications  Medication Sig Dispense Refill   Cholecalciferol (VITAMIN D) 10 MCG/ML LIQD Take 1 drop by mouth daily. (Patient not taking: Reported on 12/25/2020)     Digestive Enzymes (DIGESTIVE ENZYME PO) Take by mouth.     OVER THE COUNTER MEDICATION Tums or Prilosec OTC as needed     Probiotic Product (PROBIOTIC-10 PO) Take by mouth.     No current facility-administered medications for this visit.     Allergies:   Penicillins   Social History:  The patient  reports that she has never smoked. She has never used smokeless tobacco. She reports current alcohol use of about 9.0 standard drinks per week. She reports that she does not use drugs.   Family History:   family history includes Cancer in her sister; Heart failure in her mother; Lung cancer in her paternal grandfather.    Review of Systems: ROS   PHYSICAL EXAM: VS:  There were no vitals taken for this visit. , BMI There is no height or weight on file to calculate BMI. GEN: Well nourished, well developed, in no acute distress HEENT: normal Neck: no JVD, carotid bruits, or masses Cardiac: RRR; no murmurs, rubs, or gallops,no edema  Respiratory:  clear to auscultation bilaterally, normal work of breathing GI: soft, nontender, nondistended, + BS MS: no deformity or atrophy Skin: warm and dry, no rash Neuro:  Strength and sensation are intact Psych: euthymic mood, full affect    Recent Labs: 12/25/2020: ALT 12; BUN 15; Creatinine, Ser 0.67; Hemoglobin 13.2; Platelets 229.0; Potassium 3.8; Sodium 139;  TSH 3.91    Lipid Panel Lab Results  Component Value Date   CHOL 232 (H) 12/25/2020   HDL 80.40 12/25/2020   LDLCALC 134 (H) 12/25/2020   TRIG 92.0 12/25/2020      Wt Readings from Last 3 Encounters:  12/25/20 157 lb (71.2 kg)  12/25/20 157 lb (71.2 kg)  09/10/20 154 lb (69.9 kg)       ASSESSMENT AND PLAN:  Problem List Items Addressed This Visit   None    Disposition:   F/U  12  months   Total encounter time more than 25 minutes  Greater than 50% was spent in counseling and coordination of care with the patient    Signed, Esmond Plants, M.D., Ph.D. [place], [place]

## 2021-06-03 ENCOUNTER — Other Ambulatory Visit: Payer: Self-pay | Admitting: Nurse Practitioner

## 2021-06-03 DIAGNOSIS — Z1231 Encounter for screening mammogram for malignant neoplasm of breast: Secondary | ICD-10-CM

## 2021-07-10 ENCOUNTER — Ambulatory Visit
Admission: RE | Admit: 2021-07-10 | Discharge: 2021-07-10 | Disposition: A | Discharge status: Home / Self Care | Payer: Medicare HMO | Source: Ambulatory Visit | Attending: Nurse Practitioner | Admitting: Nurse Practitioner

## 2021-07-10 DIAGNOSIS — Z1231 Encounter for screening mammogram for malignant neoplasm of breast: Secondary | ICD-10-CM | POA: Diagnosis not present

## 2021-07-11 NOTE — Progress Notes (Signed)
Cardiology Office Note ? ?Date:  07/13/2021  ? ?ID:  Shelby Griffith, DOB 23-Oct-1955, MRN [id number] ? ?PCP:  Michela Pitcher, NP  ? ?Chief Complaint  ?Patient presents with  ? New Patient (Initial Visit)  ?  Ref by Karl Ito, NP for a medical history of pericardial effusion; needs to establish care with a Cardiologist, last seen in Dr. Lang Snow in [place], [place]. ?"Doing well." Medications reviewed by the patient verbally.   ? ? ?HPI:  ?Ms. Joaquina Nissen is a 66 year old woman with past medical history of ?Pericardial effusion, remote in 2006 and follow-up echo 2009 ?Hyperlipidemia, untreated ?Who presents for new patient evaluation of her cardiac risk factors ? ?Not on any medications for blood pressure or cholesterol ?No prior cardiac CT screening ?No recent echocardiogram ?Remote history of small pericardial effusion noted on echo 2006, repeat echo 2009 with very small pericardial effusion no other abnormality noted ? ?Carotid ultrasound 2021 ?Color duplex indicates minimal heterogeneous plaque, with no ?hemodynamically significant stenosis by duplex criteria in the ?extracranial cerebrovascular circulation ? ?Total  chol 232, typically runs high ? ?No acute issues ?No regular exercise program ? ?EKG personally reviewed by myself on todays visit ?Normal sinus rhythm rate 71 bpm no significant ST or T wave changes ? ?PMH:   has a past medical history of Arthritis and GERD (gastroesophageal reflux disease). ? ?PSH:    ?Past Surgical History:  ?Procedure Laterality Date  ? CESAREAN SECTION    ? x 3  ? COLONOSCOPY WITH PROPOFOL N/A 06/14/2017  ? Procedure: COLONOSCOPY WITH PROPOFOL;  Surgeon: Lin Landsman, MD;  Location: [facility] ENDOSCOPY;  Service: Gastroenterology;  Laterality: N/A;  ? COLONOSCOPY WITH PROPOFOL N/A 06/19/2020  ? Procedure: COLONOSCOPY WITH PROPOFOL;  Surgeon: Lin Landsman, MD;  Location: [place];  Service: Endoscopy;  Laterality: N/A;  ? ENDOMETRIAL ABLATION    ?  ESOPHAGOGASTRODUODENOSCOPY (EGD) WITH PROPOFOL N/A 02/07/2020  ? Procedure: ESOPHAGOGASTRODUODENOSCOPY (EGD) WITH PROPOFOL;  Surgeon: Lin Landsman, MD;  Location: [facility] ENDOSCOPY;  Service: Gastroenterology;  Laterality: N/A;  ? POLYPECTOMY  06/19/2020  ? Procedure: POLYPECTOMY;  Surgeon: Lin Landsman, MD;  Location: [place];  Service: Endoscopy;;  ? THYROID LOBECTOMY Right 1986  ? thyroid lobectomy Right   ? TONSILLECTOMY  1977  ? VARICOSE VEIN SURGERY Bilateral   ? ? ?Current Outpatient Medications  ?Medication Sig Dispense Refill  ? Cholecalciferol (VITAMIN D) 10 MCG/ML LIQD Take 1 drop by mouth daily.    ? Digestive Enzymes (DIGESTIVE ENZYME PO) Take by mouth.    ? OVER THE COUNTER MEDICATION Tums or Prilosec OTC as needed    ? Probiotic Product (PROBIOTIC-10 PO) Take by mouth.    ? ?No current facility-administered medications for this visit.  ? ? ?Allergies:   Penicillins  ? ?Social History:  The patient  reports that she has never smoked. She has never used smokeless tobacco. She reports current alcohol use of about 9.0 standard drinks per week. She reports that she does not use drugs.  ? ?Family History:   family history includes Cancer in her sister; Heart failure in her mother; Lung cancer in her paternal grandfather.  ? ? ?Review of Systems: ?Review of Systems  ?Constitutional: Negative.   ?HENT: Negative.    ?Respiratory: Negative.    ?Cardiovascular: Negative.   ?Gastrointestinal: Negative.   ?Musculoskeletal: Negative.   ?Neurological: Negative.   ?Psychiatric/Behavioral: Negative.    ?All other systems reviewed and are negative. ? ? ?PHYSICAL EXAM: ?VS:  BP 130/78 (BP Location: Right Arm, Patient Position: Sitting, Cuff Size: Normal)   Pulse 71   Ht '5\' 5"'$  (1.651 m)   Wt 69.1 kg   SpO2 98%   BMI 25.34 kg/m?  , BMI Body mass index is 25.34 kg/m?. ?GEN: Well nourished, well developed, in no acute distress ?HEENT: normal ?Neck: no JVD, carotid bruits, or masses ?Cardiac: RRR;  no murmurs, rubs, or gallops,no edema  ?Respiratory:  clear to auscultation bilaterally, normal work of breathing ?GI: soft, nontender, nondistended, + BS ?MS: no deformity or atrophy ?Skin: warm and dry, no rash ?Neuro:  Strength and sensation are intact ?Psych: euthymic mood, full affect ? ? ?Recent Labs: ?12/25/2020: ALT 12; BUN 15; Creatinine, Ser 0.67; Hemoglobin 13.2; Platelets 229.0; Potassium 3.8; Sodium 139; TSH 3.91  ? ? ?Lipid Panel ?Lab Results  ?Component Value Date  ? CHOL 232 (H) 12/25/2020  ? HDL 80.40 12/25/2020  ? LDLCALC 134 (H) 12/25/2020  ? TRIG 92.0 12/25/2020  ? ?  ? ?Wt Readings from Last 3 Encounters:  ?07/13/21 69.1 kg  ?12/25/20 71.2 kg  ?12/25/20 71.2 kg  ?  ? ?ASSESSMENT AND PLAN: ? ?Problem List Items Addressed This Visit   ?None ?Visit Diagnoses   ? ? Pericardial effusion    -  Primary  ? Relevant Orders  ? EKG 12-Lead  ? Mixed hyperlipidemia      ? Relevant Orders  ? EKG 12-Lead  ? CT CARDIAC SCORING (SELF PAY ONLY)  ? ?  ? ?Pericardial effusion ?Minimal noted on echocardiogram 2009, no strong indication for repeat studies at this time, having no new symptoms of shortness of breath ? ?Hyperlipidemia ?Currently not treated, ?Recommended screening study with CT coronary calcium scoring for risk stratification ? ?carotid plaque ?Minimal noted on ultrasound 2021 no indication for repeat study ?Calcium scoring as above for further risk stratification to determine if cholesterol treatment needed ? ? ? Total encounter time more than 45 minutes ? Greater than 50% was spent in counseling and coordination of care with the patient ? ? ? ?Signed, ?Esmond Plants, M.D., Ph.D. ?[facility] [place], [place] ?[phone] ?

## 2021-07-13 ENCOUNTER — Encounter: Payer: Self-pay | Admitting: Cardiovascular Disease

## 2021-07-13 ENCOUNTER — Ambulatory Visit: Payer: Medicare HMO | Admitting: Cardiovascular Disease

## 2021-07-13 VITALS — BP 130/78 | HR 71 | Ht 65.0 in | Wt 152.2 lb

## 2021-07-13 DIAGNOSIS — E782 Mixed hyperlipidemia: Secondary | ICD-10-CM | POA: Diagnosis not present

## 2021-07-13 DIAGNOSIS — I3139 Other pericardial effusion (noninflammatory): Secondary | ICD-10-CM | POA: Diagnosis not present

## 2021-07-13 NOTE — Patient Instructions (Addendum)
Medication Instructions:  ?No changes ? ?If you need a refill on your cardiac medications before your next appointment, please call your pharmacy.  ? ? ?Lab work: ?No new labs needed ? ? ?Testing/Procedures: ? ?1) CT Coronary Calcium Score: for hyperlipidemia ? ?Your physician has recommended that you have CT Coronary Calcium Score. ? ?- $99 out of pocket cost at the time of your test ?- Call [phone] to schedule at your convenience. ? ?Location: ?[address] ?[place], [place] [address] ?  ? ? ?Follow-Up: ?At [facility], you and your health needs are our priority.  As part of our continuing mission to provide you with exceptional heart care, we have created designated Provider Care Teams.  These Care Teams include your primary Cardiologist (physician) and Advanced Practice Providers (APPs -  Physician Assistants and Nurse Practitioners) who all work together to provide you with the care you need, when you need it. ? ?You will need a follow up appointment as needed ? ?Providers on your designated Care Team:   ?Murray Hodgkins, NP ?Christell Faith, PA-C ?Cadence Kathlen Mody, PA-C ? ?COVID-19 Vaccine Information can be found at: [email or web address] For questions related to vaccine distribution or appointments, please email [email or web address] or call [phone].  ? ?

## 2021-07-22 DIAGNOSIS — L821 Other seborrheic keratosis: Secondary | ICD-10-CM | POA: Diagnosis not present

## 2021-07-22 DIAGNOSIS — D2372 Other benign neoplasm of skin of left lower limb, including hip: Secondary | ICD-10-CM | POA: Diagnosis not present

## 2021-07-22 DIAGNOSIS — L814 Other melanin hyperpigmentation: Secondary | ICD-10-CM | POA: Diagnosis not present

## 2021-08-03 ENCOUNTER — Telehealth: Payer: Self-pay | Admitting: Nurse Practitioner

## 2021-08-03 NOTE — Telephone Encounter (Signed)
Left message for patient to call back and schedule Medicare Annual Wellness Visit (AWV) either virtually or phone' ? ?LAST WTM 11/07/19 ? please schedule at anytime with health coach ? ?This should be a 45 minute visit.  ? ?I left my direct # [phone] ?

## 2021-09-18 ENCOUNTER — Telehealth: Payer: Self-pay | Admitting: Nurse Practitioner

## 2021-09-30 ENCOUNTER — Ambulatory Visit (INDEPENDENT_AMBULATORY_CARE_PROVIDER_SITE_OTHER): Payer: Medicare HMO

## 2021-09-30 VITALS — Ht 65.0 in | Wt 150.0 lb

## 2021-09-30 DIAGNOSIS — Z0000 Encounter for general adult medical examination without abnormal findings: Secondary | ICD-10-CM

## 2021-09-30 NOTE — Patient Instructions (Signed)
Shelby Griffith , Thank you for taking time to come for your Medicare Wellness Visit. I appreciate your ongoing commitment to your health goals. Please review the following plan we discussed and let me know if I can assist you in the future.   Screening recommendations/referrals: Colonoscopy: completed 06/19/2020, due 06/19/2025 Mammogram: completed 07/10/2021, due 07/12/2022 Bone Density: completed 02/11/2021 Recommended yearly ophthalmology/optometry visit for glaucoma screening and checkup Recommended yearly dental visit for hygiene and checkup  Vaccinations: Influenza vaccine: due 10/27/2021 Pneumococcal vaccine: due Tdap vaccine: due Shingles vaccine: discussed   Covid-19: 02/16/2020, 07/08/2019, 06/17/2019  Advanced directives: Please bring a copy of your POA (Power of Attorney) and/or Living Will to your next appointment.   Conditions/risks identified: none  Next appointment: Follow up in one year for your annual wellness visit    Preventive Care 65 Years and Older, Female Preventive care refers to lifestyle choices and visits with your health care provider that can promote health and wellness. What does preventive care include? A yearly physical exam. This is also called an annual well check. Dental exams once or twice a year. Routine eye exams. Ask your health care provider how often you should have your eyes checked. Personal lifestyle choices, including: Daily care of your teeth and gums. Regular physical activity. Eating a healthy diet. Avoiding tobacco and drug use. Limiting alcohol use. Practicing safe sex. Taking low-dose aspirin every day. Taking vitamin and mineral supplements as recommended by your health care provider. What happens during an annual well check? The services and screenings done by your health care provider during your annual well check will depend on your age, overall health, lifestyle risk factors, and family history of disease. Counseling  Your health  care provider may ask you questions about your: Alcohol use. Tobacco use. Drug use. Emotional well-being. Home and relationship well-being. Sexual activity. Eating habits. History of falls. Memory and ability to understand (cognition). Work and work Statistician. Reproductive health. Screening  You may have the following tests or measurements: Height, weight, and BMI. Blood pressure. Lipid and cholesterol levels. These may be checked every 5 years, or more frequently if you are over 58 years old. Skin check. Lung cancer screening. You may have this screening every year starting at age 30 if you have a 30-pack-year history of smoking and currently smoke or have quit within the past 15 years. Fecal occult blood test (FOBT) of the stool. You may have this test every year starting at age 45. Flexible sigmoidoscopy or colonoscopy. You may have a sigmoidoscopy every 5 years or a colonoscopy every 10 years starting at age 53. Hepatitis C blood test. Hepatitis B blood test. Sexually transmitted disease (STD) testing. Diabetes screening. This is done by checking your blood sugar (glucose) after you have not eaten for a while (fasting). You may have this done every 1-3 years. Bone density scan. This is done to screen for osteoporosis. You may have this done starting at age 25. Mammogram. This may be done every 1-2 years. Talk to your health care provider about how often you should have regular mammograms. Talk with your health care provider about your test results, treatment options, and if necessary, the need for more tests. Vaccines  Your health care provider may recommend certain vaccines, such as: Influenza vaccine. This is recommended every year. Tetanus, diphtheria, and acellular pertussis (Tdap, Td) vaccine. You may need a Td booster every 10 years. Zoster vaccine. You may need this after age 41. Pneumococcal 13-valent conjugate (PCV13) vaccine. One dose  is recommended after age  65. Pneumococcal polysaccharide (PPSV23) vaccine. One dose is recommended after age 7. Talk to your health care provider about which screenings and vaccines you need and how often you need them. This information is not intended to replace advice given to you by your health care provider. Make sure you discuss any questions you have with your health care provider. Document Released: 04/11/2015 Document Revised: 12/03/2015 Document Reviewed: 01/14/2015 Elsevier Interactive Patient Education  2017 [place] Prevention in the Home Falls can cause injuries. They can happen to people of all ages. There are many things you can do to make your home safe and to help prevent falls. What can I do on the outside of my home? Regularly fix the edges of walkways and driveways and fix any cracks. Remove anything that might make you trip as you walk through a door, such as a raised step or threshold. Trim any bushes or trees on the path to your home. Use bright outdoor lighting. Clear any walking paths of anything that might make someone trip, such as rocks or tools. Regularly check to see if handrails are loose or broken. Make sure that both sides of any steps have handrails. Any raised decks and porches should have guardrails on the edges. Have any leaves, snow, or ice cleared regularly. Use sand or salt on walking paths during winter. Clean up any spills in your garage right away. This includes oil or grease spills. What can I do in the bathroom? Use night lights. Install grab bars by the toilet and in the tub and shower. Do not use towel bars as grab bars. Use non-skid mats or decals in the tub or shower. If you need to sit down in the shower, use a plastic, non-slip stool. Keep the floor dry. Clean up any water that spills on the floor as soon as it happens. Remove soap buildup in the tub or shower regularly. Attach bath mats securely with double-sided non-slip rug tape. Do not have throw  rugs and other things on the floor that can make you trip. What can I do in the bedroom? Use night lights. Make sure that you have a light by your bed that is easy to reach. Do not use any sheets or blankets that are too big for your bed. They should not hang down onto the floor. Have a firm chair that has side arms. You can use this for support while you get dressed. Do not have throw rugs and other things on the floor that can make you trip. What can I do in the kitchen? Clean up any spills right away. Avoid walking on wet floors. Keep items that you use a lot in easy-to-reach places. If you need to reach something above you, use a strong step stool that has a grab bar. Keep electrical cords out of the way. Do not use floor polish or wax that makes floors slippery. If you must use wax, use non-skid floor wax. Do not have throw rugs and other things on the floor that can make you trip. What can I do with my stairs? Do not leave any items on the stairs. Make sure that there are handrails on both sides of the stairs and use them. Fix handrails that are broken or loose. Make sure that handrails are as long as the stairways. Check any carpeting to make sure that it is firmly attached to the stairs. Fix any carpet that is loose or worn. Avoid  having throw rugs at the top or bottom of the stairs. If you do have throw rugs, attach them to the floor with carpet tape. Make sure that you have a light switch at the top of the stairs and the bottom of the stairs. If you do not have them, ask someone to add them for you. What else can I do to help prevent falls? Wear shoes that: Do not have high heels. Have rubber bottoms. Are comfortable and fit you well. Are closed at the toe. Do not wear sandals. If you use a stepladder: Make sure that it is fully opened. Do not climb a closed stepladder. Make sure that both sides of the stepladder are locked into place. Ask someone to hold it for you, if  possible. Clearly mark and make sure that you can see: Any grab bars or handrails. First and last steps. Where the edge of each step is. Use tools that help you move around (mobility aids) if they are needed. These include: Canes. Walkers. Scooters. Crutches. Turn on the lights when you go into a dark area. Replace any light bulbs as soon as they burn out. Set up your furniture so you have a clear path. Avoid moving your furniture around. If any of your floors are uneven, fix them. If there are any pets around you, be aware of where they are. Review your medicines with your doctor. Some medicines can make you feel dizzy. This can increase your chance of falling. Ask your doctor what other things that you can do to help prevent falls. This information is not intended to replace advice given to you by your health care provider. Make sure you discuss any questions you have with your health care provider. Document Released: 01/09/2009 Document Revised: 08/21/2015 Document Reviewed: 04/19/2014 Elsevier Interactive Patient Education  2017 [employer].

## 2021-09-30 NOTE — Progress Notes (Signed)
I connected with Shelby Griffith today by telephone and verified that I am speaking with the correct person using two identifiers. Location patient: home Location provider: work Persons participating in the virtual visit: Kathryne Ramella, Glenna Durand LPN.   I discussed the limitations, risks, security and privacy concerns of performing an evaluation and management service by telephone and the availability of in person appointments. I also discussed with the patient that there may be a patient responsible charge related to this service. The patient expressed understanding and verbally consented to this telephonic visit.    Interactive audio and video telecommunications were attempted between this provider and patient, however failed, due to patient having technical difficulties OR patient did not have access to video capability.  We continued and completed visit with audio only.     Vital signs may be patient reported or missing.  Subjective:   Shelby Griffith is a 66 y.o. female who presents for an Initial Medicare Annual Wellness Visit.  Review of Systems     Cardiac Risk Factors include: advanced age (>71mn, >>12women)     Objective:    Today's Vitals   09/30/21 1126  Weight: 150 lb (68 kg)  Height: '5\' 5"'  (1.651 m)   Body mass index is 24.96 kg/m.     09/30/2021   11:29 AM 06/19/2020    9:14 AM 02/07/2020   11:00 AM 06/14/2017   12:42 PM 01/22/2016    2:16 PM  Advanced Directives  Does Patient Have a Medical Advance Directive? Yes Yes Yes No No  Type of AParamed[place]of [place] will [place] will [place] will    Does patient want to make changes to medical advance directive?  No - Patient declined     Copy of [place] Chart? No - copy requested Yes - validated most recent copy scanned in chart (See row information) No - copy requested    Would patient like  information on creating a medical advance directive?    No - Patient declined     Current Medications (verified) Outpatient Encounter Medications as of 09/30/2021  Medication Sig   Cholecalciferol (VITAMIN D) 10 MCG/ML LIQD Take 1 drop by mouth daily.   Digestive Enzymes (DIGESTIVE ENZYME PO) Take by mouth.   OVER THE COUNTER MEDICATION Tums or Prilosec OTC as needed   Probiotic Product (PROBIOTIC-10 PO) Take by mouth.   No facility-administered encounter medications on file as of 09/30/2021.    Allergies (verified) Penicillins   History: Past Medical History:  Diagnosis Date   Arthritis    left hip   GERD (gastroesophageal reflux disease)    occasional    Past Surgical History:  Procedure Laterality Date   CESAREAN SECTION     x 3   COLONOSCOPY WITH PROPOFOL N/A 06/14/2017   Procedure: COLONOSCOPY WITH PROPOFOL;  Surgeon: VLin Landsman MD;  Location: [facility] ENDOSCOPY;  Service: Gastroenterology;  Laterality: N/A;   COLONOSCOPY WITH PROPOFOL N/A 06/19/2020   Procedure: COLONOSCOPY WITH PROPOFOL;  Surgeon: VLin Landsman MD;  [place]  Service: [place]oscopy;  Laterality: N/A;   ENDOMETRIAL ABLATION     ESOPHAGOGASTRODUODENOSCOPY (EGD) WITH PROPOFOL N/A 02/07/2020   Procedure: ESOPHAGOGASTRODUODENOSCOPY (EGD) WITH PROPOFOL;  Surgeon: VLin Landsman MD;  [facility];  Service: Gastroenterology;  Laterality: N/A;   POLYPECTOMY  06/19/2020   Procedure: POLYPECTOMY;  Surgeon: VLin Landsman MD;  [place]  Service: Endoscopy;;  THYROID LOBECTOMY Right 1986   thyroid lobectomy Right    TONSILLECTOMY  1977   VARICOSE VEIN SURGERY Bilateral    Family History  Problem Relation Age of Onset   Heart failure Mother    Lung cancer Paternal Grandfather    Cancer Sister        Glioblastoma   Breast cancer Neg Hx    Esophageal cancer Neg Hx    Stomach cancer Neg Hx    Social History   Socioeconomic History    Marital status: Married    Spouse name: Not on file   Number of children: Not on file   Years of education: Not on file   Highest education level: Not on file  Occupational History   Not on file  Tobacco Use   Smoking status: Never   Smokeless tobacco: Never  Vaping Use   Vaping Use: Never used  Substance and Sexual Activity   Alcohol use: Yes    Alcohol/week: 9.0 standard drinks of alcohol    Types: 9 Glasses of wine per week   Drug use: No   Sexual activity: Yes    Birth control/protection: Post-menopausal  Other Topics Concern   Not on file  Social History Narrative   Not on file   Social Determinants of Health   Financial Resource Strain: Low Risk  (09/30/2021)   Overall Financial Resource Strain (CARDIA)    Difficulty of Paying Living Expenses: Not hard at all  Food Insecurity: No Food Insecurity (09/30/2021)   Hunger Vital Sign    Worried About Running Out of Food in the Last Year: Never true    Ran Out of Food in the Last Year: Never true  Transportation Needs: No Transportation Needs (09/30/2021)   PRAPARE - Hydrologist (Medical): No    Lack of Transportation (Non-Medical): No  Physical Activity: Inactive (09/30/2021)   Exercise Vital Sign    Days of Exercise per Week: 0 days    Minutes of Exercise per Session: 0 min  Stress: No Stress Concern Present (09/30/2021)   [place]    Feeling of Stress : Not at all  Social Connections: Not on file    Tobacco Counseling Counseling given: Not Answered   Clinical Intake:  Pre-visit preparation completed: Yes  Pain : No/denies pain     Nutritional Status: BMI of 19-24  Normal Nutritional Risks: None Diabetes: No  How often do you need to have someone help you when you read instructions, pamphlets, or other written materials from your doctor or pharmacy?: 1 - Never What is the last grade level you completed in school?:  39yrcollege  Diabetic? no  Interpreter Needed?: No  Information entered by :: NAllen LPN   Activities of Daily Living    09/30/2021   11:29 AM  In your present state of health, do you have any difficulty performing the following activities:  Hearing? 0  Vision? 0  Difficulty concentrating or making decisions? 0  Walking or climbing stairs? 0  Dressing or bathing? 0  Doing errands, shopping? 0  Preparing Food and eating ? N  Using the Toilet? N  In the past six months, have you accidently leaked urine? N  Do you have problems with loss of bowel control? N  Managing your Medications? N  Managing your Finances? N  Housekeeping or managing your Housekeeping? N    Patient Care Team: CMichela Pitcher NP as PCP -  General  Indicate any recent Medical Services you may have received from other than [facility] providers in the past year (date may be approximate).     Assessment:   This is a routine wellness examination for Shelby Griffith.  Hearing/Vision screen Vision Screening - Comments:: No regular eye exams,  Dietary issues and exercise activities discussed: Current Exercise Habits: The patient does not participate in regular exercise at present   Goals Addressed             This Visit's Progress    Patient Stated       09/30/2021, no goals       Depression Screen    09/30/2021   11:29 AM 12/25/2020    2:54 PM 11/07/2019    2:45 PM 03/14/2018    2:21 PM 12/06/2016    1:13 PM 11/12/2015    7:22 PM 05/09/2015    3:40 PM  PHQ 2/9 Scores  PHQ - 2 Score 0 0 0 0 0 0 0  PHQ- 9 Score    0       Fall Risk    09/30/2021   11:29 AM 12/25/2020    9:38 AM 03/11/2020    3:50 PM 11/07/2019    2:44 PM 12/06/2016    1:13 PM  [place] in the past year? 0 0 0 0 No  Number falls in past yr: 0 0     Injury with Fall? 0 0     Risk for fall due to : No Fall Risks      Follow up Falls evaluation completed;Education provided;Falls prevention discussed        FALL RISK PREVENTION  PERTAINING TO THE HOME:  Any stairs in or around the home? Yes  If so, are there any without handrails? No  Home free of loose throw rugs in walkways, pet beds, electrical cords, etc? Yes  Adequate lighting in your home to reduce risk of falls? Yes   ASSISTIVE DEVICES UTILIZED TO PREVENT FALLS:  Life alert? No  Use of a cane, walker or w/c? No  Grab bars in the bathroom? No  Shower chair or bench in shower? No  Elevated toilet seat or a handicapped toilet? Yes   TIMED UP AND GO:  Was the test performed? No .      Cognitive Function:        09/30/2021   11:30 AM  6CIT Screen  What Year? 0 points  What month? 0 points  What time? 0 points  Count back from 20 0 points  Months in reverse 0 points  Repeat phrase 2 points  Total Score 2 points    Immunizations Immunization History  Administered Date(s) Administered   Hepatitis A 02/07/2009, 08/19/2009   Hepatitis B 02/07/2009, 03/18/2009, 08/19/2009   Influenza,inj,Quad PF,6+ Mos 03/12/2016, 01/18/2017, 01/26/2018, 12/15/2018, 01/23/2020, 12/25/2020   Influenza-Unspecified 12/27/2013   MMR 03/05/2009   PFIZER(Purple Top)SARS-COV-2 Vaccination 06/17/2019, 07/08/2019, 02/16/2020   Tdap 03/05/2009    TDAP status: Due, Education has been provided regarding the importance of this vaccine. Advised may receive this vaccine at local pharmacy or Health Dept. Aware to provide a copy of the vaccination record if obtained from local pharmacy or Health Dept. Verbalized acceptance and understanding.  Flu Vaccine status: Up to date  Pneumococcal vaccine status: Due, Education has been provided regarding the importance of this vaccine. Advised may receive this vaccine at local pharmacy or Health Dept. Aware to provide a copy of the vaccination record if  obtained from local pharmacy or Health Dept. Verbalized acceptance and understanding.  Covid-19 vaccine status: Completed vaccines  Qualifies for Shingles Vaccine? Yes   Zostavax  completed No   Shingrix Completed?: No.    Education has been provided regarding the importance of this vaccine. Patient has been advised to call insurance company to determine out of pocket expense if they have not yet received this vaccine. Advised may also receive vaccine at local pharmacy or Health Dept. Verbalized acceptance and understanding.  Screening Tests Health Maintenance  Topic Date Due   Zoster Vaccines- Shingrix (1 of 2) Never done   TETANUS/TDAP  03/06/2019   Pneumonia Vaccine 86+ Years old (1 - PCV) Never done   COVID-19 Vaccine (4 - Pfizer series) 04/12/2020   INFLUENZA VACCINE  10/27/2021   MAMMOGRAM  07/11/2022   COLONOSCOPY (Pts 45-69yr Insurance coverage will need to be confirmed)  06/19/2025   DEXA SCAN  Completed   Hepatitis C Screening  Addressed   HPV VACCINES  Aged Out    Health Maintenance  Health Maintenance Due  Topic Date Due   Zoster Vaccines- Shingrix (1 of 2) Never done   TETANUS/TDAP  03/06/2019   Pneumonia Vaccine 67 Years old (1 - PCV) Never done   COVID-19 Vaccine (4 - Pfizer series) 04/12/2020    Colorectal cancer screening: Type of screening: Colonoscopy. Completed 06/19/2020. Repeat every 5 years  Mammogram status: Completed 07/10/2021. Repeat every year  Bone Density status: Completed 02/11/2021.   Lung Cancer Screening: (Low Dose CT Chest recommended if Age 316-80years, 30 pack-year currently smoking OR have quit w/in 15years.) does not qualify.   Lung Cancer Screening Referral: no  Additional Screening:  Hepatitis C Screening: does qualify; Completed 05/16/2014  Vision Screening: Recommended annual ophthalmology exams for early detection of glaucoma and other disorders of the eye. Is the patient up to date with their annual eye exam?  No  Who is the provider or what is the name of the office in which the patient attends annual eye exams? none If pt is not established with a provider, would they like to be referred to a provider  to establish care? No .   Dental Screening: Recommended annual dental exams for proper oral hygiene  Community Resource Referral / Chronic Care Management: CRR required this visit?  No   CCM required this visit?  No      Plan:     I have personally reviewed and noted the following in the patient's chart:   Medical and social history Use of alcohol, tobacco or illicit drugs  Current medications and supplements including opioid prescriptions. Patient is not currently taking opioid prescriptions. Functional ability and status Nutritional status Physical activity Advanced directives List of other physicians Hospitalizations, surgeries, and ER visits in previous 12 months Vitals Screenings to include cognitive, depression, and falls Referrals and appointments  In addition, I have reviewed and discussed with patient certain preventive protocols, quality metrics, and best practice recommendations. A written personalized care plan for preventive services as well as general preventive health recommendations were provided to patient.     NKellie Simmering LPN   [id number]  Nurse Notes: none  Due to this being a virtual visit, the after visit summary with patients personalized plan was offered to patient via mail or my-chart. Patient would like to access on my-chart

## 2021-11-02 DIAGNOSIS — N390 Urinary tract infection, site not specified: Secondary | ICD-10-CM | POA: Diagnosis not present

## 2021-11-02 DIAGNOSIS — R350 Frequency of micturition: Secondary | ICD-10-CM | POA: Diagnosis not present

## 2021-11-23 ENCOUNTER — Ambulatory Visit: Payer: Self-pay

## 2021-11-23 NOTE — Patient Outreach (Signed)
  Care Coordination   11/23/2021 Name: Shelby Griffith MRN: [id number] DOB: 06/26/1955   Care Coordination Outreach Attempts:  An unsuccessful telephone outreach was attempted today to offer the patient information about available care coordination services as a benefit of their health plan.   Follow Up Plan:  Additional outreach attempts will be made to offer the patient care coordination information and services.   Encounter Outcome:  No Answer  Care Coordination Interventions Activated:  No   Care Coordination Interventions:  No, not indicated    Quinn Plowman RN,BSN,CCM RN Care Manager Coordinator [phone]

## 2021-12-29 ENCOUNTER — Ambulatory Visit (INDEPENDENT_AMBULATORY_CARE_PROVIDER_SITE_OTHER): Payer: Medicare HMO | Admitting: Nurse Practitioner

## 2021-12-29 ENCOUNTER — Encounter: Payer: Self-pay | Admitting: Nurse Practitioner

## 2021-12-29 VITALS — BP 110/78 | HR 65 | Temp 97.2°F | Ht 65.0 in | Wt 153.6 lb

## 2021-12-29 DIAGNOSIS — Z23 Encounter for immunization: Secondary | ICD-10-CM | POA: Diagnosis not present

## 2021-12-29 DIAGNOSIS — E663 Overweight: Secondary | ICD-10-CM | POA: Diagnosis not present

## 2021-12-29 DIAGNOSIS — E041 Nontoxic single thyroid nodule: Secondary | ICD-10-CM | POA: Diagnosis not present

## 2021-12-29 DIAGNOSIS — E559 Vitamin D deficiency, unspecified: Secondary | ICD-10-CM | POA: Diagnosis not present

## 2021-12-29 DIAGNOSIS — Z0000 Encounter for general adult medical examination without abnormal findings: Secondary | ICD-10-CM

## 2021-12-29 DIAGNOSIS — K219 Gastro-esophageal reflux disease without esophagitis: Secondary | ICD-10-CM | POA: Diagnosis not present

## 2021-12-29 DIAGNOSIS — E890 Postprocedural hypothyroidism: Secondary | ICD-10-CM | POA: Diagnosis not present

## 2021-12-29 LAB — LIPID PANEL
Cholesterol: 254 mg/dL — ABNORMAL HIGH (ref 0–200)
HDL: 74 mg/dL (ref >39.00)
LDL Cholesterol: 169 mg/dL — ABNORMAL HIGH (ref 0–99)
NonHDL: 180.11
Total CHOL/HDL Ratio: 3
Triglycerides: 56 mg/dL (ref 0.0–149.0)
VLDL: 11.2 mg/dL (ref 0.0–40.0)

## 2021-12-29 LAB — CBC
HCT: 41.3 % (ref 36.0–46.0)
Hemoglobin: 13.9 g/dL (ref 12.0–15.0)
MCHC: 33.5 g/dL (ref 30.0–36.0)
MCV: 95.8 fl (ref 78.0–100.0)
Platelets: 222 10*3/uL (ref 150.0–400.0)
RBC: 4.32 Mil/uL (ref 3.87–5.11)
RDW: 12.8 % (ref 11.5–15.5)
WBC: 4.4 10*3/uL (ref 4.0–10.5)

## 2021-12-29 LAB — T4, FREE: Free T4: 0.87 ng/dL (ref 0.60–1.60)

## 2021-12-29 LAB — TSH: TSH: 4.23 u[IU]/mL (ref 0.35–5.50)

## 2021-12-29 LAB — COMPREHENSIVE METABOLIC PANEL
ALT: 11 U/L (ref 0–35)
AST: 21 U/L (ref 0–37)
Albumin: 4.7 g/dL (ref 3.5–5.2)
Alkaline Phosphatase: 70 U/L (ref 39–117)
BUN: 14 mg/dL (ref 6–23)
CO2: 28 mEq/L (ref 19–32)
Calcium: 9.6 mg/dL (ref 8.4–10.5)
Chloride: 100 mEq/L (ref 96–112)
Creatinine, Ser: 0.75 mg/dL (ref 0.40–1.20)
GFR: 82.53 mL/min (ref >60.00)
Glucose, Bld: 89 mg/dL (ref 70–99)
Potassium: 4.1 mEq/L (ref 3.5–5.1)
Sodium: 138 mEq/L (ref 135–145)
Total Bilirubin: 0.7 mg/dL (ref 0.2–1.2)
Total Protein: 7.1 g/dL (ref 6.0–8.3)

## 2021-12-29 LAB — HEMOGLOBIN A1C: Hgb A1c MFr Bld: 5.8 % (ref 4.6–6.5)

## 2021-12-29 LAB — T3, FREE: T3, Free: 3.5 pg/mL (ref 2.3–4.2)

## 2021-12-29 LAB — VITAMIN D 25 HYDROXY (VIT D DEFICIENCY, FRACTURES): VITD: 49.86 ng/mL (ref 30.00–100.00)

## 2021-12-29 NOTE — Progress Notes (Signed)
Established Patient Office Visit  Subjective   Patient ID: Shelby Griffith, female    DOB: 01/05/1956  Age: 66 y.o. MRN: [id number]  Chief Complaint  Patient presents with   Annual Exam    Right sided neck pain X 3 months    HPI   GERD: States that she uses tus or prolissec. 2-3 times a week. No diet modificatoin. Usually happens in the evening when she lays down  Vitamin d: Patient currently doing over-the-counter supplementation  Thyroid nodule: hx of [place] and FNA for complete physical and follow up of chronic conditions.  Patient has been followed by surgery post partial thyroidectomy.  No longer followed by provider  Immunizations: -Tetanus:Needs updating -Influenza: up date today -Shingles: Needs updating information discussed today -Pneumonia: Needs updating  -HPV: Aged out  Diet: [place]. 2 meals a day with a small breakfast. Coffee in the am.  Water throughout the day and glass of wine for dinner Exercise: No regular exercise. Walking some. 3-30 min walks last week  Eye exam: PRN. Readers and prescription for distance Dental exam: Completes semi-annually   Pap Smear: Completed in 2019 thinks after that at [facility] [place]. Mammogram: Completed in 07/10/2021  Colonoscopy: Completed in 2022, recall 5 years Lung Cancer Screening: N/A Dexa: Completed in 2022, repeat in 2024   Sleep: goes to bed around 10-11 and gets up 7-8a. Feels rested most of the time.   Advanced directives: health care directive and living will       Review of Systems  Constitutional:  Negative for chills and fever.  Respiratory:  Negative for shortness of breath.   Cardiovascular:  Negative for chest pain.  Gastrointestinal:  Negative for abdominal pain, blood in stool, constipation, melena, nausea and vomiting.       BM daily Some days are a miss  Genitourinary:  Negative for dysuria and hematuria.  Neurological:  Negative for headaches.  Psychiatric/Behavioral:   Negative for suicidal ideas.       Objective:     BP 110/78   Pulse 65   Temp (!) 97.2 F (36.2 C) (Temporal)   Ht '5\' 5"'$  (1.651 m)   Wt 153 lb 9.6 oz (69.7 kg)   SpO2 98%   BMI 25.56 kg/m    Physical Exam Vitals and nursing note reviewed.  Constitutional:      Appearance: Normal appearance.  HENT:     Right Ear: Tympanic membrane, ear canal and external ear normal.     Left Ear: Tympanic membrane, ear canal and external ear normal.     Mouth/Throat:     Mouth: Mucous membranes are moist.     Pharynx: Oropharynx is clear.  Eyes:     Extraocular Movements: Extraocular movements intact.     Pupils: Pupils are equal, round, and reactive to light.  Cardiovascular:     Rate and Rhythm: Normal rate and regular rhythm.     Pulses: Normal pulses.     Heart sounds: Normal heart sounds.  Pulmonary:     Effort: Pulmonary effort is normal.     Breath sounds: Normal breath sounds.  Abdominal:     General: Bowel sounds are normal. There is no distension.     Palpations: There is no mass.     Tenderness: There is no abdominal tenderness.     Hernia: No hernia is present.  Musculoskeletal:     Right lower leg: No edema.     Left lower leg: No edema.  Lymphadenopathy:     Cervical: No cervical adenopathy.  Skin:    General: Skin is warm.  Neurological:     General: No focal deficit present.     Mental Status: She is alert.     Deep Tendon Reflexes:     Reflex Scores:      Bicep reflexes are 2+ on the right side and 2+ on the left side.      Patellar reflexes are 2+ on the right side and 2+ on the left side.    Comments: Bilateral upper and lower extremity strength 5/5  Psychiatric:        Mood and Affect: Mood normal.        Behavior: Behavior normal.        Thought Content: Thought content normal.        Judgment: Judgment normal.      No results found for any visits on 12/29/21.    The 10-year ASCVD risk score (Arnett DK, et al., 2019) is: 4.9%    Assessment  & Plan:   Problem List Items Addressed This Visit       Digestive   GERD (gastroesophageal reflux disease)    Patient currently taking over-the-counter Tums or Prilosec on a as needed basis.        Endocrine   Thyroid nodule    History of partial thyroidectomy along with fine-needle aspiration biopsy in the past.  Last ultrasound was done in 2022.  Ultrasound of thyroid placed today information to the patient to call and get appointment set up      Relevant Orders   T3, free   T4, free   TSH   US THYROID     Other   Vitamin D deficiency    Pending vitamin D level      Preventative health care    Discussed age-appropriate immunizations and screening exams.      Relevant Orders   CBC   Comprehensive metabolic panel   Hemoglobin A1c   Lipid panel   H/O partial thyroidectomy - Primary    Pending T3, T3-4, TSH.  Pending ultrasound of thyroid      Relevant Orders   T3, free   T4, free   TSH   Overweight   Relevant Orders   Hemoglobin A1c   Lipid panel   Other Visit Diagnoses     Need for immunization against influenza       Need for influenza vaccination       Relevant Orders   Flu Vaccine QUAD High Dose(Fluad) (Completed)       Return in about 1 year (around 12/30/2022) for CPE and labs.    Romilda Garret, NP

## 2021-12-29 NOTE — Assessment & Plan Note (Signed)
Pending vitamin D level

## 2021-12-29 NOTE — Addendum Note (Signed)
Addended by: Ellamae Sia on: 12/29/2021 02:52 PM   Modules accepted: Orders

## 2021-12-29 NOTE — Assessment & Plan Note (Signed)
Discussed age-appropriate immunizations and screening exams.

## 2021-12-29 NOTE — Patient Instructions (Addendum)
Nice to see you today I will be in touch with the labs once I have the results Follow up with me in 1 year, sooner if you need me We gave you the flu vaccine today

## 2021-12-29 NOTE — Assessment & Plan Note (Signed)
Patient currently taking over-the-counter Tums or Prilosec on a as needed basis.

## 2021-12-29 NOTE — Assessment & Plan Note (Addendum)
History of partial thyroidectomy along with fine-needle aspiration biopsy in the past.  Last ultrasound was done in 2022.  Ultrasound of thyroid placed today information to the patient to call and get appointment set up

## 2021-12-29 NOTE — Assessment & Plan Note (Signed)
Pending T3, T3-4, TSH.  Pending ultrasound of thyroid

## 2022-01-05 ENCOUNTER — Ambulatory Visit
Admission: RE | Admit: 2022-01-05 | Discharge: 2022-01-05 | Disposition: A | Discharge status: Home / Self Care | Payer: Medicare HMO | Source: Ambulatory Visit | Attending: Nurse Practitioner | Admitting: Nurse Practitioner

## 2022-01-05 DIAGNOSIS — E041 Nontoxic single thyroid nodule: Secondary | ICD-10-CM | POA: Insufficient documentation

## 2022-01-05 DIAGNOSIS — E042 Nontoxic multinodular goiter: Secondary | ICD-10-CM | POA: Diagnosis not present

## 2022-01-12 ENCOUNTER — Ambulatory Visit: Payer: Medicare HMO

## 2022-01-13 ENCOUNTER — Ambulatory Visit
Admission: RE | Admit: 2022-01-13 | Discharge: 2022-01-13 | Disposition: A | Discharge status: Home / Self Care | Payer: Medicare HMO | Source: Ambulatory Visit | Attending: Cardiovascular Disease | Admitting: Cardiovascular Disease

## 2022-01-13 DIAGNOSIS — E782 Mixed hyperlipidemia: Secondary | ICD-10-CM | POA: Insufficient documentation

## 2022-07-27 ENCOUNTER — Other Ambulatory Visit: Payer: Self-pay | Admitting: Nurse Practitioner

## 2022-07-27 DIAGNOSIS — Z1231 Encounter for screening mammogram for malignant neoplasm of breast: Secondary | ICD-10-CM

## 2022-08-04 DIAGNOSIS — H2513 Age-related nuclear cataract, bilateral: Secondary | ICD-10-CM | POA: Diagnosis not present

## 2022-08-04 DIAGNOSIS — H524 Presbyopia: Secondary | ICD-10-CM | POA: Diagnosis not present

## 2022-08-04 DIAGNOSIS — H5203 Hypermetropia, bilateral: Secondary | ICD-10-CM | POA: Diagnosis not present

## 2022-08-04 DIAGNOSIS — H52223 Regular astigmatism, bilateral: Secondary | ICD-10-CM | POA: Diagnosis not present

## 2022-08-04 DIAGNOSIS — H40013 Open angle with borderline findings, low risk, bilateral: Secondary | ICD-10-CM | POA: Diagnosis not present

## 2022-08-04 DIAGNOSIS — D3132 Benign neoplasm of left choroid: Secondary | ICD-10-CM | POA: Diagnosis not present

## 2022-08-09 IMAGING — DX DG NECK SOFT TISSUE
1 series · 1 of 1 positions shown · non-contrast
Comparison: None.

CLINICAL DATA: Possible foreign body.

EXAM:
NECK SOFT TISSUES - 1+ VIEW

[neck lat]
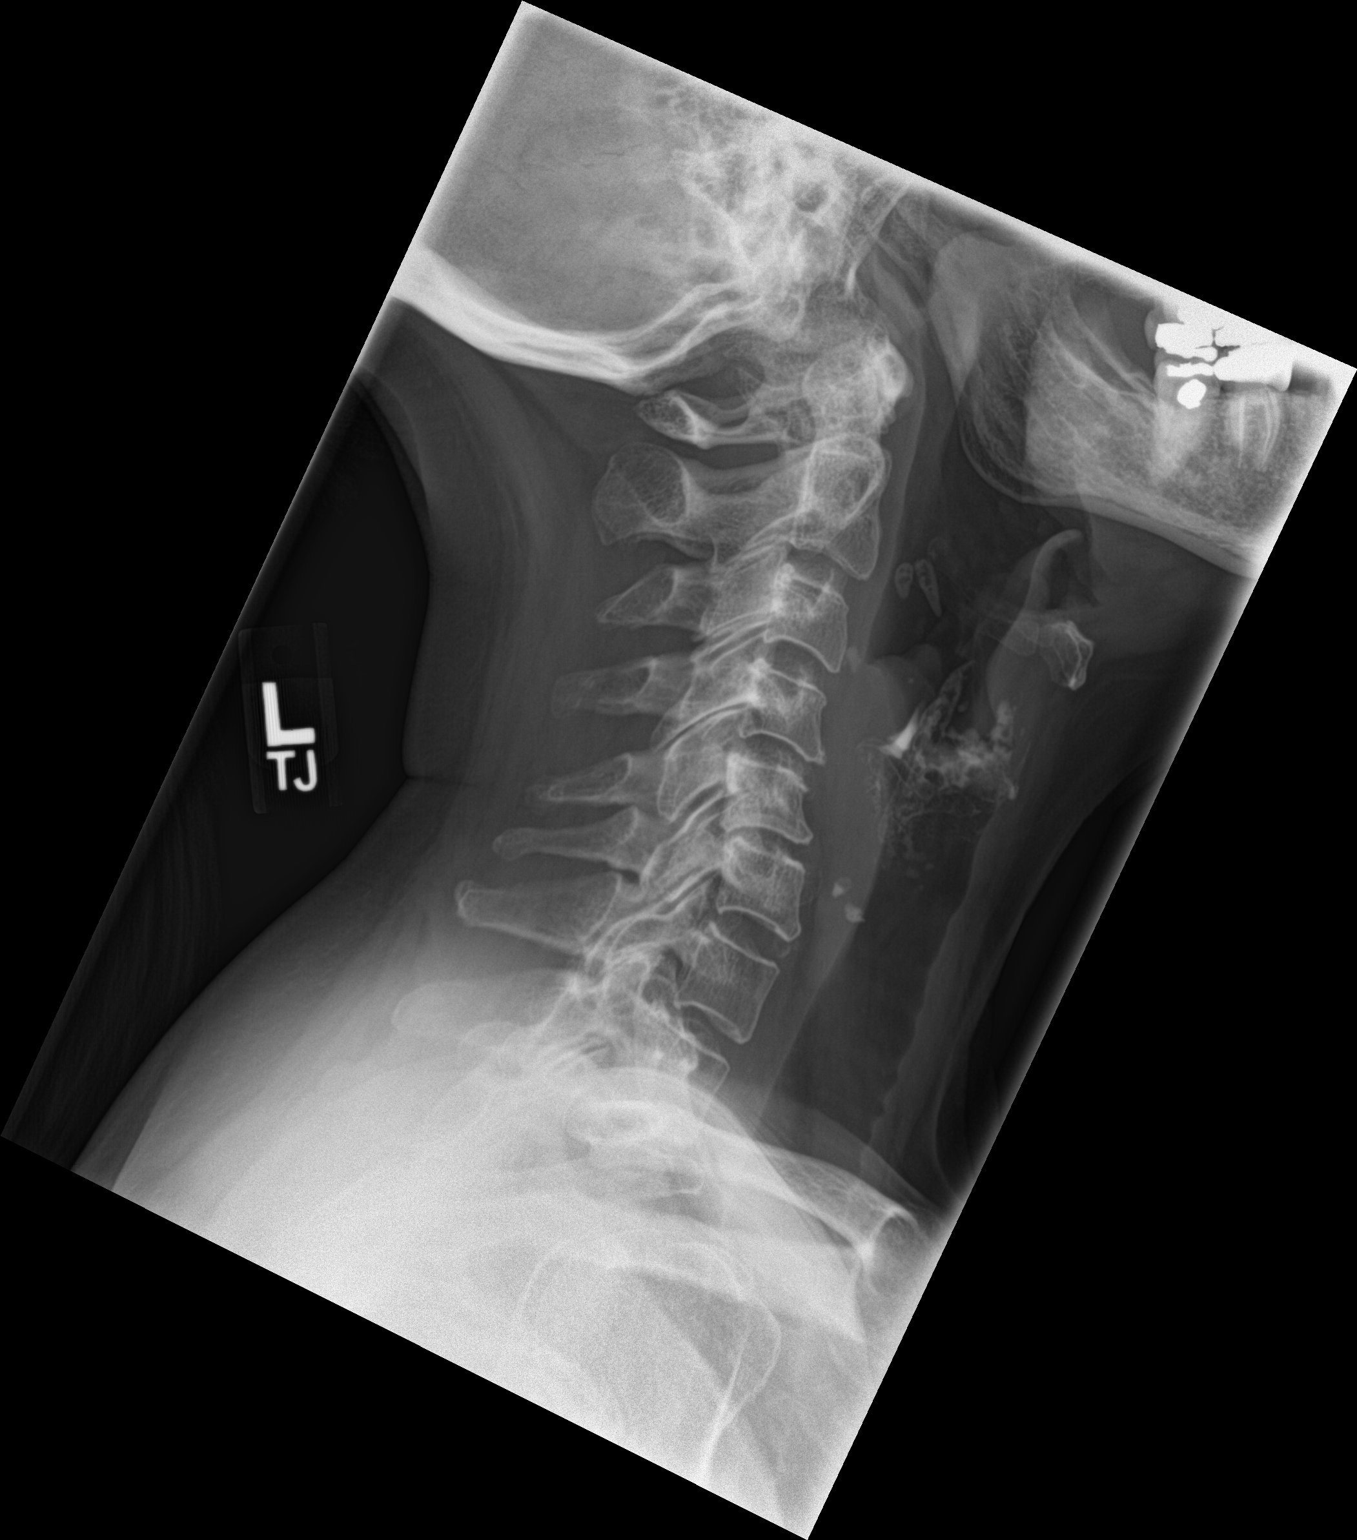

[1 of 1 positions shown; findings below may reference images not displayed]

FINDINGS: There is no evidence of retropharyngeal soft tissue swelling or
epiglottic enlargement. The cervical airway is unremarkable and no
radio-opaque foreign body identified. Calcification of thyroid
cartilage is noted. Carotid artery calcifications cannot be
excluded.
IMPRESSION: Negative. No radiopaque foreign body is noted. Calcification of
thyroid cartilage is noted. Carotid artery calcifications cannot be
excluded; carotid artery ultrasound is recommended.

## 2022-08-27 DIAGNOSIS — D485 Neoplasm of uncertain behavior of skin: Secondary | ICD-10-CM | POA: Diagnosis not present

## 2022-08-27 DIAGNOSIS — L821 Other seborrheic keratosis: Secondary | ICD-10-CM | POA: Diagnosis not present

## 2022-08-27 DIAGNOSIS — D2372 Other benign neoplasm of skin of left lower limb, including hip: Secondary | ICD-10-CM | POA: Diagnosis not present

## 2022-08-27 DIAGNOSIS — L814 Other melanin hyperpigmentation: Secondary | ICD-10-CM | POA: Diagnosis not present

## 2022-09-01 ENCOUNTER — Ambulatory Visit
Admission: RE | Admit: 2022-09-01 | Discharge: 2022-09-01 | Disposition: A | Discharge status: Home / Self Care | Payer: Medicare HMO | Source: Ambulatory Visit | Attending: Nurse Practitioner | Admitting: Nurse Practitioner

## 2022-09-01 DIAGNOSIS — Z1231 Encounter for screening mammogram for malignant neoplasm of breast: Secondary | ICD-10-CM

## 2022-10-08 ENCOUNTER — Encounter: Payer: Self-pay | Admitting: Internal Medicine

## 2022-10-08 ENCOUNTER — Ambulatory Visit (INDEPENDENT_AMBULATORY_CARE_PROVIDER_SITE_OTHER): Payer: Medicare HMO | Admitting: Internal Medicine

## 2022-10-08 VITALS — BP 120/76 | HR 63 | Temp 97.5°F | Ht 65.0 in | Wt 154.0 lb

## 2022-10-08 DIAGNOSIS — K602 Anal fissure, unspecified: Secondary | ICD-10-CM | POA: Diagnosis not present

## 2022-10-08 MED ORDER — HYDROCORTISONE 2.5 % EX CREA: TOPICAL_CREAM | Freq: Three times a day (TID) | CUTANEOUS | 1 refills | Status: DC | PRN

## 2022-10-08 NOTE — Progress Notes (Signed)
Subjective:    Patient ID: Shelby Griffith, female    DOB: [birth year], 67 y.o.   MRN: [id number]  HPI Here due to rectal pain  About a month ago--had hard time moving bowels, like impacted Used 2 enemas and finally able to go (strained) Started with pain Tried prep H cream (and witch hazel wipe)--still feels raw  Bowels now regular --even loose at times No blood  Current Outpatient Medications on File Prior to Visit  Medication Sig Dispense Refill   Cholecalciferol (VITAMIN D) 10 MCG/ML LIQD Take 1 drop by mouth daily.     OVER THE COUNTER MEDICATION Tums or Prilosec OTC as needed     Probiotic Product (PROBIOTIC-10 PO) Take by mouth.     Digestive Enzymes (DIGESTIVE ENZYME PO) Take by mouth. (Patient not taking: Reported on 12/29/2021)     No current facility-administered medications on file prior to visit.    Allergies  Allergen Reactions   Penicillins Rash    Past Medical History:  Diagnosis Date   Arthritis    left hip   GERD (gastroesophageal reflux disease)    occasional     Past Surgical History:  Procedure Laterality Date   CESAREAN SECTION     x 3   COLONOSCOPY WITH PROPOFOL N/A 06/14/2017   Procedure: COLONOSCOPY WITH PROPOFOL;  Surgeon: Toney Reil, MD;  Location: [facility] ENDOSCOPY;  Service: Gastroenterology;  Laterality: N/A;   COLONOSCOPY WITH PROPOFOL N/A 06/19/2020   Procedure: COLONOSCOPY WITH PROPOFOL;  Surgeon: Toney Reil, MD;  Location: [facility];  Service: Endoscopy;  Laterality: N/A;   ENDOMETRIAL ABLATION     ESOPHAGOGASTRODUODENOSCOPY (EGD) WITH PROPOFOL N/A 02/07/2020   Procedure: ESOPHAGOGASTRODUODENOSCOPY (EGD) WITH PROPOFOL;  Surgeon: Toney Reil, MD;  Location: [facility] ENDOSCOPY;  Service: Gastroenterology;  Laterality: N/A;   POLYPECTOMY  06/19/2020   Procedure: POLYPECTOMY;  Surgeon: Toney Reil, MD;  Location: [facility];  Service: Endoscopy;;   THYROID LOBECTOMY Right 1986   thyroid  lobectomy Right    TONSILLECTOMY  1977   VARICOSE VEIN SURGERY Bilateral     Family History  Problem Relation Age of Onset   Heart failure Mother    Cancer Sister 14       Glioblastoma   Stomach cancer Paternal Grandfather    Breast cancer Neg Hx    Esophageal cancer Neg Hx     Social History   Socioeconomic History   Marital status: Married    Spouse name: Caryn Bee   Number of children: 3   Years of education: Not on file   Highest education level: Not on file  Occupational History   Not on file  Tobacco Use   Smoking status: Never   Smokeless tobacco: Never  Vaping Use   Vaping status: Never Used  Substance and Sexual Activity   Alcohol use: Yes    Alcohol/week: 9.0 standard drinks of alcohol    Types: 9 Glasses of wine per week    Comment: glass a night   Drug use: No   Sexual activity: Yes    Birth control/protection: Post-menopausal  Other Topics Concern   Not on file  Social History Narrative   Ethelene Browns 38, Diana 37, Lauren 33      Retired      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    Social Determinants of Health   Financial Resource Strain: Low Risk  (09/30/2021)   Overall Financial Resource Strain (CARDIA)    Difficulty of  Paying Living Expenses: Not hard at all  Food Insecurity: No Food Insecurity (09/30/2021)   Hunger Vital Sign    Worried About Running Out of Food in the Last Year: Never true    Ran Out of Food in the Last Year: Never true  Transportation Needs: No Transportation Needs (09/30/2021)   PRAPARE - Administrator, Civil Service (Medical): No    Lack of Transportation (Non-Medical): No  Physical Activity: Inactive (09/30/2021)   Exercise Vital Sign    Days of Exercise per Week: 0 days    Minutes of Exercise per Session: 0 min  Stress: No Stress Concern Present (09/30/2021)   [employer] of Occupational Health - Occupational Stress Questionnaire    Feeling of Stress : Not at all  Social Connections: Unknown  (11/02/2021)   Received from [facility], [facility]   Social Network    Social Network: Not on file  Intimate Partner Violence: Unknown (11/02/2021)   Received from [facility], [facility]   HITS    Physically Hurt: Not on file    Insult or Talk Down To: Not on file    Threaten Physical Harm: Not on file    Scream or Curse: Not on file   Review of Systems No abdominal pain One episode of vomiting about 3 weeks ago--otherwise no N/V     Objective:   Physical Exam Genitourinary:    Comments: Raw excoriated area around 5 o'clock at rectum Very superficial fissure at the anus in that area           Assessment & Plan:

## 2022-10-08 NOTE — Assessment & Plan Note (Signed)
Superficial --along with excoriated area Will try hydrocortisone cream 2.5% Stop the witch hazel Miralax or senna s as needed if any trouble with bowels Could consider diltiazem topical or nitroglycerin if ongoing symptoms

## 2022-11-17 ENCOUNTER — Encounter: Payer: Self-pay | Admitting: *Deleted

## 2022-11-17 ENCOUNTER — Ambulatory Visit (INDEPENDENT_AMBULATORY_CARE_PROVIDER_SITE_OTHER): Payer: Medicare HMO | Admitting: Internal Medicine

## 2022-11-17 VITALS — BP 120/84 | HR 86 | Temp 97.0°F | Ht 65.0 in | Wt 157.0 lb

## 2022-11-17 DIAGNOSIS — K6289 Other specified diseases of anus and rectum: Secondary | ICD-10-CM

## 2022-11-17 NOTE — Assessment & Plan Note (Signed)
No fissure and no clear lesions The pain feels external--but no clear etiology Recommended trying topical lidocaine Will refer to general surgeon for second opinion

## 2022-11-17 NOTE — Progress Notes (Signed)
Subjective:    Patient ID: Shelby Griffith, female    DOB: Oct 18, 1955, 67 y.o.   MRN: [id number]  HPI Here due to ongoing rectal problems  Very uncomfortable Trouble when she is sitting---near the top of her buttocks Had pilonidal cyst removed as teenager--but she feels it up there No problems when standing--mostly when sitting  Hydrocortisone cream may have helped at first---then not really  Bowels move regular Loose at times No blood in stool or toilet paper  Current Outpatient Medications on File Prior to Visit  Medication Sig Dispense Refill   Cholecalciferol (VITAMIN D) 10 MCG/ML LIQD Take 1 drop by mouth daily.     Digestive Enzymes (DIGESTIVE ENZYME PO) Take by mouth. (Patient not taking: Reported on 12/29/2021)     hydrocortisone 2.5 % cream Apply topically 3 (three) times daily as needed. 28 g 1   OVER THE COUNTER MEDICATION Tums or Prilosec OTC as needed     Probiotic Product (PROBIOTIC-10 PO) Take by mouth.     No current facility-administered medications on file prior to visit.    Allergies  Allergen Reactions   Penicillins Rash    Past Medical History:  Diagnosis Date   Arthritis    left hip   GERD (gastroesophageal reflux disease)    occasional     Past Surgical History:  Procedure Laterality Date   CESAREAN SECTION     x 3   COLONOSCOPY WITH PROPOFOL N/A 06/14/2017   Procedure: COLONOSCOPY WITH PROPOFOL;  Surgeon: Toney Reil, MD;  Location: [facility] ENDOSCOPY;  Service: Gastroenterology;  Laterality: N/A;   COLONOSCOPY WITH PROPOFOL N/A 06/19/2020   Procedure: COLONOSCOPY WITH PROPOFOL;  Surgeon: Toney Reil, MD;  Location: [facility];  Service: Endoscopy;  Laterality: N/A;   ENDOMETRIAL ABLATION     ESOPHAGOGASTRODUODENOSCOPY (EGD) WITH PROPOFOL N/A 02/07/2020   Procedure: ESOPHAGOGASTRODUODENOSCOPY (EGD) WITH PROPOFOL;  Surgeon: Toney Reil, MD;  Location: [facility] ENDOSCOPY;  Service: Gastroenterology;  Laterality: N/A;    POLYPECTOMY  06/19/2020   Procedure: POLYPECTOMY;  Surgeon: Toney Reil, MD;  Location: [facility];  Service: Endoscopy;;   THYROID LOBECTOMY Right 1986   thyroid lobectomy Right    TONSILLECTOMY  1977   VARICOSE VEIN SURGERY Bilateral     Family History  Problem Relation Age of Onset   Heart failure Mother    Cancer Sister 6       Glioblastoma   Stomach cancer Paternal Grandfather    Breast cancer Neg Hx    Esophageal cancer Neg Hx     Social History   Socioeconomic History   Marital status: Married    Spouse name: Caryn Bee   Number of children: 3   Years of education: Not on file   Highest education level: Some college, no degree  Occupational History   Not on file  Tobacco Use   Smoking status: Never   Smokeless tobacco: Never  Vaping Use   Vaping status: Never Used  Substance and Sexual Activity   Alcohol use: Yes    Alcohol/week: 9.0 standard drinks of alcohol    Types: 9 Glasses of wine per week    Comment: glass a night   Drug use: No   Sexual activity: Yes    Birth control/protection: Post-menopausal  Other Topics Concern   Not on file  Social History Narrative   Ethelene Browns 39, Diana 37, Lauren 33      Retired      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    Social Determinants of Health   Financial Resource Strain: Low Risk  (11/17/2022)   Overall Financial Resource Strain (CARDIA)    Difficulty of Paying Living Expenses: Not hard at all  Food Insecurity: No Food Insecurity (11/17/2022)   Hunger Vital Sign    Worried About Running Out of Food in the Last Year: Never true    Ran Out of Food in the Last Year: Never true  Transportation Needs: No Transportation Needs (11/17/2022)   PRAPARE - Administrator, Civil Service (Medical): No    Lack of Transportation (Non-Medical): No  Physical Activity: Insufficiently Active (11/17/2022)   Exercise Vital Sign    Days of Exercise per Week: 2 days    Minutes of Exercise  per Session: 30 min  Stress: No Stress Concern Present (11/17/2022)   [employer] of Occupational Health - Occupational Stress Questionnaire    Feeling of Stress : Not at all  Social Connections: Unknown (11/17/2022)   Social Connection and Isolation Panel [NHANES]    Frequency of Communication with Friends and Family: More than three times a week    Frequency of Social Gatherings with Friends and Family: Twice a week    Attends Religious Services: Patient declined    Database administrator or Organizations: Yes    Attends Engineer, structural: More than 4 times per year    Marital Status: Married  Catering manager Violence: Unknown (11/02/2021)   Received from [facility], [facility]   HITS    Physically Hurt: Not on file    Insult or Talk Down To: Not on file    Threaten Physical Harm: Not on file    Scream or Curse: Not on file   Review of Systems Appetite is good Weight is stable No fever    Objective:   Physical Exam Genitourinary:    Comments: Well healed pilonidal cyst scar---and no lesions/tenderness there No apparent external hemorrhoids or fissure now No internal hemorrhoids or rectal mass           Assessment & Plan:

## 2022-11-30 ENCOUNTER — Ambulatory Visit
Admission: EM | Admit: 2022-11-30 | Discharge: 2022-11-30 | Disposition: A | Discharge status: Home / Self Care | Payer: Medicare HMO | Attending: Emergency Medicine | Admitting: Emergency Medicine

## 2022-11-30 DIAGNOSIS — R300 Dysuria: Secondary | ICD-10-CM | POA: Diagnosis not present

## 2022-11-30 DIAGNOSIS — R350 Frequency of micturition: Secondary | ICD-10-CM | POA: Insufficient documentation

## 2022-11-30 LAB — POCT URINALYSIS DIP (MANUAL ENTRY)
Bilirubin, UA: NEGATIVE
Blood, UA: NEGATIVE
Glucose, UA: NEGATIVE mg/dL
Ketones, POC UA: NEGATIVE mg/dL
Nitrite, UA: NEGATIVE
Protein Ur, POC: NEGATIVE mg/dL
Spec Grav, UA: 1.015 (ref 1.010–1.025)
Urobilinogen, UA: 0.2 U/dL
pH, UA: 6 (ref 5.0–8.0)

## 2022-11-30 MED ORDER — SULFAMETHOXAZOLE-TRIMETHOPRIM 800-160 MG PO TABS: 1.0000 | ORAL_TABLET | Freq: Two times a day (BID) | ORAL | 0 refills | Status: AC

## 2022-11-30 NOTE — ED Provider Notes (Signed)
Renaldo Fiddler    CSN: [id number] Arrival date & time: 11/30/22  1513      History   Chief Complaint Chief Complaint  Patient presents with   Urinary Frequency    HPI Shelby Griffith is a 67 y.o. female.  Patient presents with 2-day history of urinary frequency and mild dysuria at the end of urination.  She denies fever, abdominal pain, flank pain, or other symptoms.  No treatments at home.  Patient is flying [place] for a [place] tomorrow.  The history is provided by the patient and medical records.    Past Medical History:  Diagnosis Date   Arthritis    left hip   GERD (gastroesophageal reflux disease)    occasional     Patient Active Problem List   Diagnosis Date Noted   Rectal pain 11/17/2022   Anal fissure 10/08/2022   H/O partial thyroidectomy 12/29/2021   Overweight 12/29/2021   Vitamin D deficiency 12/25/2020   Preventative health care 12/25/2020   Nonintractable headache 09/10/2020   History of colonic polyps    Thyroid nodule 03/13/2020   Foreign body in esophagus    OA (osteoarthritis) of hip 11/07/2019   GERD (gastroesophageal reflux disease) 11/07/2019    Past Surgical History:  Procedure Laterality Date   CESAREAN SECTION     x 3   COLONOSCOPY WITH PROPOFOL N/A 06/14/2017   Procedure: COLONOSCOPY WITH PROPOFOL;  Surgeon: Toney Reil, MD;  Location: [facility] ENDOSCOPY;  Service: Gastroenterology;  Laterality: N/A;   COLONOSCOPY WITH PROPOFOL N/A 06/19/2020   Procedure: COLONOSCOPY WITH PROPOFOL;  Surgeon: Toney Reil, MD;  Location: [facility];  Service: Endoscopy;  Laterality: N/A;   ENDOMETRIAL ABLATION     ESOPHAGOGASTRODUODENOSCOPY (EGD) WITH PROPOFOL N/A 02/07/2020   Procedure: ESOPHAGOGASTRODUODENOSCOPY (EGD) WITH PROPOFOL;  Surgeon: Toney Reil, MD;  Location: [facility] ENDOSCOPY;  Service: Gastroenterology;  Laterality: N/A;   POLYPECTOMY  06/19/2020   Procedure: POLYPECTOMY;  Surgeon: Toney Reil,  MD;  Location: [facility];  Service: Endoscopy;;   THYROID LOBECTOMY Right 1986   thyroid lobectomy Right    TONSILLECTOMY  1977   VARICOSE VEIN SURGERY Bilateral     OB History   No obstetric history on file.      Home Medications    Prior to Admission medications   Medication Sig Start Date End Date Taking? Authorizing Provider  sulfamethoxazole-trimethoprim (BACTRIM DS) 800-160 MG tablet Take 1 tablet by mouth 2 (two) times daily for 5 days. 11/30/22 12/05/22 Yes Mickie Bail, NP  Cholecalciferol (VITAMIN D) 10 MCG/ML LIQD Take 1 drop by mouth daily.    [provider]  Digestive Enzymes (DIGESTIVE ENZYME PO) Take by mouth. Patient not taking: Reported on 12/29/2021    [provider]  hydrocortisone 2.5 % cream Apply topically 3 (three) times daily as needed. 10/08/22   Karie Schwalbe, MD  OVER THE COUNTER MEDICATION Tums or Prilosec OTC as needed    [provider]  Probiotic Product (PROBIOTIC-10 PO) Take by mouth.    [provider]    Family History Family History  Problem Relation Age of Onset   Heart failure Mother    Cancer Sister 3       Glioblastoma   Stomach cancer Paternal Grandfather    Breast cancer Neg Hx    Esophageal cancer Neg Hx     Social History Social History   Tobacco Use   Smoking status: Never   Smokeless tobacco: Never  Vaping Use   Vaping status: Never Used  Substance Use Topics   Alcohol use: Yes    Alcohol/week: 9.0 standard drinks of alcohol    Types: 9 Glasses of wine per week    Comment: glass a night   Drug use: No     Allergies   Penicillins   Review of Systems Review of Systems  Constitutional:  Negative for chills and fever.  Gastrointestinal:  Negative for abdominal pain, nausea and vomiting.  Genitourinary:  Positive for dysuria and frequency. Negative for flank pain and hematuria.  All other systems reviewed and are negative.    Physical Exam Triage Vital Signs ED  Triage Vitals  Encounter Vitals Group     BP 11/30/22 1633 (!) 148/78     Systolic BP Percentile --      Diastolic BP Percentile --      Pulse Rate 11/30/22 1618 75     Resp 11/30/22 1618 18     Temp 11/30/22 1618 97.8 F (36.6 C)     Temp src --      SpO2 11/30/22 1618 97 %     Weight --      Height --      Head Circumference --      Peak Flow --      Pain Score 11/30/22 1631 0     Pain Loc --      Pain Education --      Exclude from Growth Chart --    No data found.  Updated Vital Signs BP (!) 148/78   Pulse 75   Temp 97.8 F (36.6 C)   Resp 18   SpO2 97%   Visual Acuity Right Eye Distance:   Left Eye Distance:   Bilateral Distance:    Right Eye Near:   Left Eye Near:    Bilateral Near:     Physical Exam Vitals and nursing note reviewed.  Constitutional:      General: She is not in acute distress.    Appearance: She is well-developed.  HENT:     Mouth/Throat:     Mouth: Mucous membranes are moist.  Cardiovascular:     Rate and Rhythm: Normal rate and regular rhythm.  Pulmonary:     Effort: Pulmonary effort is normal. No respiratory distress.  Abdominal:     General: Bowel sounds are normal.     Palpations: Abdomen is soft.     Tenderness: There is no abdominal tenderness. There is no right CVA tenderness, left CVA tenderness, guarding or rebound.  Musculoskeletal:     Cervical back: Neck supple.  Skin:    General: Skin is warm and dry.  Neurological:     Mental Status: She is alert.      UC Treatments / Results  Labs (all labs ordered are listed, but only abnormal results are displayed) Labs Reviewed  POCT URINALYSIS DIP (MANUAL ENTRY) - Abnormal; Notable for the following components:      Result Value   Clarity, UA turbid (*)    Leukocytes, UA Small (1+) (*)    All other components within normal limits  URINE CULTURE    EKG   Radiology No results found.  Procedures Procedures (including critical care time)  Medications Ordered  in UC Medications - No data to display  Initial Impression / Assessment and Plan / UC Course  I have reviewed the triage vital signs and the nursing notes.  Pertinent labs & imaging results that were available during  my care of the patient were reviewed by me and considered in my medical decision making (see chart for details).    Urinary frequency and dysuria.  Patient is flying overseas for a cruise tomorrow.  Her urine has small leukocytes.  Per patient preference and based on previous urine culture from 2023, treating today with Bactrim.  Patient is allergic to penicillin and does not want to risk taking a cephalosporin due to the possibility of cross allergy since she will be out of the country.  Again, per patient preference, she will attempt symptomatic treatment including cranberry and increased water intake before she starts the antibiotic.  Discussed that we will notify her if the urine culture shows the need to discontinue or change the antibiotic.  Education provided on dysuria.  Instructed patient to follow up with her PCP.  She agrees to plan of care.    Final Clinical Impressions(s) / UC Diagnoses   Final diagnoses:  Urinary frequency  Dysuria     Discharge Instructions      Take the antibiotic as directed.  The urine culture is pending.  We will call you if it shows the need to change or discontinue your antibiotic.    Follow up with your primary care provider if your symptoms are not improving.        ED Prescriptions     Medication Sig Dispense Auth. Provider   sulfamethoxazole-trimethoprim (BACTRIM DS) 800-160 MG tablet Take 1 tablet by mouth 2 (two) times daily for 5 days. 10 tablet Mickie Bail, NP      PDMP not reviewed this encounter.   Mickie Bail, NP 11/30/22 [phone]

## 2022-11-30 NOTE — ED Triage Notes (Signed)
Patient to Urgent Care with complaints of urinary frequency. Reports some discomfort at the end of urination. Denies any fevers. Denies any flank/ back pain.  Symptoms started approx two days ago.

## 2022-11-30 NOTE — Discharge Instructions (Addendum)
Take the antibiotic as directed.  The urine culture is pending.  We will call you if it shows the need to change or discontinue your antibiotic.    Follow up with your primary care provider if your symptoms are not improving.    

## 2022-12-02 LAB — URINE CULTURE: Culture: 20000 — AB

## 2022-12-22 DIAGNOSIS — Z86010 Personal history of colonic polyps: Secondary | ICD-10-CM | POA: Diagnosis not present

## 2022-12-22 DIAGNOSIS — K594 Anal spasm: Secondary | ICD-10-CM | POA: Diagnosis not present

## 2022-12-31 ENCOUNTER — Ambulatory Visit (INDEPENDENT_AMBULATORY_CARE_PROVIDER_SITE_OTHER): Payer: Medicare HMO | Admitting: Nurse Practitioner

## 2022-12-31 VITALS — BP 116/80 | HR 57 | Temp 97.7°F | Ht 65.0 in | Wt 154.2 lb

## 2022-12-31 DIAGNOSIS — Z0000 Encounter for general adult medical examination without abnormal findings: Secondary | ICD-10-CM

## 2022-12-31 DIAGNOSIS — K602 Anal fissure, unspecified: Secondary | ICD-10-CM | POA: Diagnosis not present

## 2022-12-31 DIAGNOSIS — Z23 Encounter for immunization: Secondary | ICD-10-CM

## 2022-12-31 DIAGNOSIS — E559 Vitamin D deficiency, unspecified: Secondary | ICD-10-CM

## 2022-12-31 DIAGNOSIS — E041 Nontoxic single thyroid nodule: Secondary | ICD-10-CM | POA: Diagnosis not present

## 2022-12-31 DIAGNOSIS — Z13820 Encounter for screening for osteoporosis: Secondary | ICD-10-CM

## 2022-12-31 DIAGNOSIS — Z13220 Encounter for screening for lipoid disorders: Secondary | ICD-10-CM | POA: Diagnosis not present

## 2022-12-31 LAB — TSH: TSH: 2.17 u[IU]/mL (ref 0.35–5.50)

## 2022-12-31 LAB — CBC
HCT: 41.2 % (ref 36.0–46.0)
Hemoglobin: 13.2 g/dL (ref 12.0–15.0)
MCHC: 32 g/dL (ref 30.0–36.0)
MCV: 97 fL (ref 78.0–100.0)
Platelets: 233 10*3/uL (ref 150.0–400.0)
RBC: 4.25 Mil/uL (ref 3.87–5.11)
RDW: 12.6 % (ref 11.5–15.5)
WBC: 6 10*3/uL (ref 4.0–10.5)

## 2022-12-31 LAB — LIPID PANEL
Cholesterol: 244 mg/dL — ABNORMAL HIGH (ref 0–200)
HDL: 74.4 mg/dL (ref >39.00)
LDL Cholesterol: 159 mg/dL — ABNORMAL HIGH (ref 0–99)
NonHDL: 169.81
Total CHOL/HDL Ratio: 3
Triglycerides: 53 mg/dL (ref 0.0–149.0)
VLDL: 10.6 mg/dL (ref 0.0–40.0)

## 2022-12-31 LAB — T3, FREE: T3, Free: 2.8 pg/mL (ref 2.3–4.2)

## 2022-12-31 LAB — VITAMIN D 25 HYDROXY (VIT D DEFICIENCY, FRACTURES): VITD: 45.86 ng/mL (ref 30.00–100.00)

## 2022-12-31 LAB — COMPREHENSIVE METABOLIC PANEL
ALT: 11 U/L (ref 0–35)
AST: 21 U/L (ref 0–37)
Albumin: 4.5 g/dL (ref 3.5–5.2)
Alkaline Phosphatase: 59 U/L (ref 39–117)
BUN: 17 mg/dL (ref 6–23)
CO2: 29 meq/L (ref 19–32)
Calcium: 9.3 mg/dL (ref 8.4–10.5)
Chloride: 105 meq/L (ref 96–112)
Creatinine, Ser: 0.69 mg/dL (ref 0.40–1.20)
GFR: 89.33 mL/min (ref >60.00)
Glucose, Bld: 80 mg/dL (ref 70–99)
Potassium: 4.3 meq/L (ref 3.5–5.1)
Sodium: 142 meq/L (ref 135–145)
Total Bilirubin: 0.6 mg/dL (ref 0.2–1.2)
Total Protein: 6.9 g/dL (ref 6.0–8.3)

## 2022-12-31 LAB — T4, FREE: Free T4: 0.86 ng/dL (ref 0.60–1.60)

## 2023-01-02 ENCOUNTER — Encounter: Payer: Self-pay | Admitting: Nurse Practitioner

## 2023-01-02 NOTE — Assessment & Plan Note (Signed)
Pending TSH 

## 2023-01-02 NOTE — Assessment & Plan Note (Signed)
History of the same pending vitamin D level today 

## 2023-01-02 NOTE — Assessment & Plan Note (Signed)
Seen by colleague and surgeon patient currently using topical cream and Metamucil.  Continue.

## 2023-01-02 NOTE — Progress Notes (Signed)
Established Patient Office Visit  Subjective   Patient ID: Shelby Griffith, female    DOB: Sep 07, 1955  Age: 67 y.o. MRN: [id number]  Chief Complaint  Patient presents with   Annual Exam    HPI for complete physical and follow up of chronic conditions.  Immunizations: -Tetanus: Completed in unsure get at local pharmacy -Influenza: Update today -Shingles: Refused -Pneumonia: Refused -COVID: Refused  Diet: Fair diet.  3 meals a day with some snacks.  Patient will drink water and coffee throughout the day Exercise: Walks 2 times a week.  Eye exam: Completes annually.  Does wear glasses for nighttime driving Dental exam: Completes semi-annually    Colonoscopy: Completed in 06/19/2020, repeat in 5 years.  Due 2027 Lung Cancer Screening: N/A  Pap smear: Up-to-date done 3 years ago through GYN.  History of abnormal Pap smears years ago  Mammogram: Up-to-date done 09/02/2022  Dexa: Needs updating  Sleep: Patient will go to bed around 10 PM and get up around 7:30 AM.  Feels rested.  States intermittent snoring  Of note patient mention that she was seen and evaluated for an anal fissure.  She was prescribed hydrocortisone cream which she has discontinued.  She was seen by the surgeon and he thought same diagnosis and put her on a compounded cream that she is currently using.  Also using daily Metamucil for bulk to stool      Review of Systems  Constitutional:  Negative for chills and fever.  Respiratory:  Negative for shortness of breath.   Cardiovascular:  Negative for chest pain and leg swelling.  Gastrointestinal:  Negative for abdominal pain, blood in stool, constipation, diarrhea, nausea and vomiting.  Genitourinary:  Negative for dysuria and hematuria.  Neurological:  Negative for tingling and headaches.  Psychiatric/Behavioral:  Negative for hallucinations and suicidal ideas.       Objective:     BP 116/80   Pulse (!) 57   Temp 97.7 F (36.5 C)   Ht 5\' 5"   (1.651 m)   Wt 154 lb 3.2 oz (69.9 kg)   SpO2 98%   BMI 25.66 kg/m    Physical Exam Vitals and nursing note reviewed.  Constitutional:      Appearance: Normal appearance.  HENT:     Right Ear: Tympanic membrane, ear canal and external ear normal.     Left Ear: Tympanic membrane, ear canal and external ear normal.     Mouth/Throat:     Mouth: Mucous membranes are moist.     Pharynx: Oropharynx is clear.  Eyes:     Extraocular Movements: Extraocular movements intact.     Pupils: Pupils are equal, round, and reactive to light.  Cardiovascular:     Rate and Rhythm: Normal rate and regular rhythm.     Pulses: Normal pulses.     Heart sounds: Normal heart sounds.  Pulmonary:     Effort: Pulmonary effort is normal.     Breath sounds: Normal breath sounds.  Abdominal:     General: Bowel sounds are normal. There is no distension.     Palpations: There is no mass.     Tenderness: There is no abdominal tenderness.     Hernia: No hernia is present.  Musculoskeletal:     Right lower leg: No edema.     Left lower leg: No edema.  Lymphadenopathy:     Cervical: No cervical adenopathy.  Skin:    General: Skin is warm.  Neurological:     General: No  focal deficit present.     Mental Status: She is alert.     Deep Tendon Reflexes:     Reflex Scores:      Bicep reflexes are 2+ on the right side and 2+ on the left side.      Patellar reflexes are 2+ on the right side and 2+ on the left side.    Comments: Bilateral upper and lower extremity strength 5/5  Psychiatric:        Mood and Affect: Mood normal.        Behavior: Behavior normal.        Thought Content: Thought content normal.        Judgment: Judgment normal.      Results for orders placed or performed in visit on 12/31/22  CBC  Result Value Ref Range   WBC 6.0 4.0 - 10.5 K/uL   RBC 4.25 3.87 - 5.11 Mil/uL   Platelets 233.0 150.0 - 400.0 K/uL   Hemoglobin 13.2 12.0 - 15.0 g/dL   HCT 16.1 09.6 - 04.5 %   MCV 97.0 78.0  - 100.0 fl   MCHC 32.0 30.0 - 36.0 g/dL   RDW 40.9 81.1 - 91.4 %  Comprehensive metabolic panel  Result Value Ref Range   Sodium 142 135 - 145 mEq/L   Potassium 4.3 3.5 - 5.1 mEq/L   Chloride 105 96 - 112 mEq/L   CO2 29 19 - 32 mEq/L   Glucose, Bld 80 70 - 99 mg/dL   BUN 17 6 - 23 mg/dL   Creatinine, Ser 7.82 0.40 - 1.20 mg/dL   Total Bilirubin 0.6 0.2 - 1.2 mg/dL   Alkaline Phosphatase 59 39 - 117 U/L   AST 21 0 - 37 U/L   ALT 11 0 - 35 U/L   Total Protein 6.9 6.0 - 8.3 g/dL   Albumin 4.5 3.5 - 5.2 g/dL   GFR 95.62 >13.08 mL/min   Calcium 9.3 8.4 - 10.5 mg/dL  TSH  Result Value Ref Range   TSH 2.17 0.35 - 5.50 uIU/mL  T3, free  Result Value Ref Range   T3, Free 2.8 2.3 - 4.2 pg/mL  T4, free  Result Value Ref Range   Free T4 0.86 0.60 - 1.60 ng/dL  Lipid panel  Result Value Ref Range   Cholesterol 244 (H) 0 - 200 mg/dL   Triglycerides 65.7 0.0 - 149.0 mg/dL   HDL 84.69 >62.95 mg/dL   VLDL 28.4 0.0 - 13.2 mg/dL   LDL Cholesterol 440 (H) 0 - 99 mg/dL   Total CHOL/HDL Ratio 3    NonHDL 169.81   VITAMIN D 25 Hydroxy (Vit-D Deficiency, Fractures)  Result Value Ref Range   VITD 45.86 30.00 - 100.00 ng/mL      The 10-year ASCVD risk score (Arnett DK, et al., 2019) is: 6.3%    Assessment & Plan:   Problem List Items Addressed This Visit       Digestive   Anal fissure    Seen by colleague and surgeon patient currently using topical cream and Metamucil.  Continue.        Endocrine   Thyroid nodule    Pending TSH      Relevant Orders   TSH (Completed)   T3, free (Completed)   T4, free (Completed)     Other   Vitamin D deficiency    History of the same pending vitamin D level today      Relevant Orders   VITAMIN D  25 Hydroxy (Vit-D Deficiency, Fractures) (Completed)   Preventative health care - Primary    Discussed age-appropriate immunizations and screening exams.  Did review patient's personal, surgical, social, family histories.  Patient is  up-to-date on all age-appropriate vaccinations she would like.  Update flu vaccine today.  Patient is up-to-date on her CRC screening and breast cancer screening.  Aged out of cervical cancer screening.  Ordered bone density scan today for patient for screening for osteoporosis.  Patient was given information at discharge about preventative healthcare maintenance with anticipatory guidance      Relevant Orders   CBC (Completed)   Comprehensive metabolic panel (Completed)   Other Visit Diagnoses     Screening for hyperlipidemia       Relevant Orders   Lipid panel (Completed)   Need for influenza vaccination       Relevant Orders   Flu Vaccine Trivalent High Dose (Fluad) (Completed)   Screening for osteoporosis       Relevant Orders   DG Bone Density       Return in about 1 year (around 12/31/2023) for CPE and Labs.    Audria Nine, NP

## 2023-01-02 NOTE — Assessment & Plan Note (Signed)
Discussed age-appropriate immunizations and screening exams.  Did review patient's personal, surgical, social, family histories.  Patient is up-to-date on all age-appropriate vaccinations she would like.  Update flu vaccine today.  Patient is up-to-date on her CRC screening and breast cancer screening.  Aged out of cervical cancer screening.  Ordered bone density scan today for patient for screening for osteoporosis.  Patient was given information at discharge about preventative healthcare maintenance with anticipatory guidance

## 2023-01-03 ENCOUNTER — Encounter: Payer: Self-pay | Admitting: Nurse Practitioner

## 2023-01-12 DIAGNOSIS — D2272 Melanocytic nevi of left lower limb, including hip: Secondary | ICD-10-CM | POA: Diagnosis not present

## 2023-01-12 DIAGNOSIS — D225 Melanocytic nevi of trunk: Secondary | ICD-10-CM | POA: Diagnosis not present

## 2023-01-12 DIAGNOSIS — D2261 Melanocytic nevi of right upper limb, including shoulder: Secondary | ICD-10-CM | POA: Diagnosis not present

## 2023-01-12 DIAGNOSIS — D2262 Melanocytic nevi of left upper limb, including shoulder: Secondary | ICD-10-CM | POA: Diagnosis not present

## 2023-01-12 DIAGNOSIS — L821 Other seborrheic keratosis: Secondary | ICD-10-CM | POA: Diagnosis not present

## 2023-01-12 DIAGNOSIS — L728 Other follicular cysts of the skin and subcutaneous tissue: Secondary | ICD-10-CM | POA: Diagnosis not present

## 2023-01-12 DIAGNOSIS — D2271 Melanocytic nevi of right lower limb, including hip: Secondary | ICD-10-CM | POA: Diagnosis not present

## 2023-02-07 ENCOUNTER — Other Ambulatory Visit: Payer: Self-pay

## 2023-02-09 ENCOUNTER — Encounter: Payer: Self-pay | Admitting: Gastroenterology

## 2023-02-09 ENCOUNTER — Ambulatory Visit: Payer: Medicare HMO | Admitting: Gastroenterology

## 2023-02-09 VITALS — BP 125/80 | HR 87 | Temp 97.8°F | Ht 65.0 in | Wt 155.2 lb

## 2023-02-09 DIAGNOSIS — K640 First degree hemorrhoids: Secondary | ICD-10-CM

## 2023-02-09 DIAGNOSIS — K6289 Other specified diseases of anus and rectum: Secondary | ICD-10-CM

## 2023-02-09 MED ORDER — HYDROCORTISONE (PERIANAL) 2.5 % EX CREA
1.0000 | TOPICAL_CREAM | Freq: Two times a day (BID) | CUTANEOUS | 1 refills | Status: AC
Start: 2023-02-09 — End: ?

## 2023-02-09 NOTE — Progress Notes (Signed)
Arlyss Repress, MD [address]  [address]  [place], [place] [address]  Main: [phone]  Fax: [phone]    Gastroenterology Consultation  Referring Provider:     Eden Emms, NP Primary Care Physician:  Eden Emms, NP Primary Gastroenterologist:  Dr. Arlyss Repress Reason for Consultation: Rectal burning, pain        HPI:   Shelby Griffith is a 67 y.o. female referred by Eden Emms, NP  for consultation & management of rectal burning, pain.  Patient states that she had an episode of severe constipation in June that resulted in significant straining, had to use enemas.  Since then, she has been experiencing severe rectal pain.  She tried over-the-counter hemorrhoidal creams such as Preparation H as well as witch hazel which did not help much.  She started taking fiber and stool softener which kept her bowels regular.  Dr. Alphonsus Sias treated her with hydrocortisone cream.  Her symptoms persisted when she saw him on 11/17/2022.  She was referred to general surgery for further evaluation.  She was evaluated by Dr. Karie Soda, colorectal surgeon with [facility] surgery, started on topical diltiazem.  She saw him in September.  Has been using the cream about 2-3 times daily.  This has provided some relief but she continues to have burning pain in the rectum.  She denies any rectal bleeding.  Therefore, she wanted to seek GI opinion.  NSAIDs: None  Antiplts/Anticoagulants/Anti thrombotics: None  GI Procedures:  Colonoscopy 06/19/2020 - One 2 to 5 mm polyp in the cecum at the ileocecal valve, removed with a cold snare. Resected and retrieved. - A tattoo was seen in the descending colon. A post- polypectomy scar was found at the tattoo site. There was no evidence of residual polyp tissue. - The distal rectum and anal verge are normal on retroflexion view. - The examination was otherwise normal.  DIAGNOSIS:  A. COLON POLYP X2, ILEOCECAL VALVE AND CECUM; COLD SNARE:   - TUBULAR ADENOMA (TWO FRAGMENTS).  - POLYPOID FRAGMENT OF COLONIC MUCOSA WITH PROMINENT SUBMUCOSAL ADIPOSE  TISSUE, CONSISTENT WITH LIPOMA VERSUS LIPOMATOUS ILEOCECAL VALVE (TWO  FRAGMENTS).  - NEGATIVE FOR HIGH-GRADE DYSPLASIA AND MALIGNANCY.    Past Medical History:  Diagnosis Date   Arthritis    left hip   GERD (gastroesophageal reflux disease)    occasional     Past Surgical History:  Procedure Laterality Date   CESAREAN SECTION     x 3   COLONOSCOPY WITH PROPOFOL N/A 06/14/2017   Procedure: COLONOSCOPY WITH PROPOFOL;  Surgeon: Toney Reil, MD;  Location: [facility] ENDOSCOPY;  Service: Gastroenterology;  Laterality: N/A;   COLONOSCOPY WITH PROPOFOL N/A 06/19/2020   Procedure: COLONOSCOPY WITH PROPOFOL;  Surgeon: Toney Reil, MD;  Location: [facility];  Service: Endoscopy;  Laterality: N/A;   ENDOMETRIAL ABLATION     ESOPHAGOGASTRODUODENOSCOPY (EGD) WITH PROPOFOL N/A 02/07/2020   Procedure: ESOPHAGOGASTRODUODENOSCOPY (EGD) WITH PROPOFOL;  Surgeon: Toney Reil, MD;  Location: [facility] ENDOSCOPY;  Service: Gastroenterology;  Laterality: N/A;   POLYPECTOMY  06/19/2020   Procedure: POLYPECTOMY;  Surgeon: Toney Reil, MD;  Location: [facility];  Service: Endoscopy;;   THYROID LOBECTOMY Right 1986   thyroid lobectomy Right    TONSILLECTOMY  1977   VARICOSE VEIN SURGERY Bilateral      Current Outpatient Medications:    Cholecalciferol (VITAMIN D) 10 MCG/ML LIQD, Take 1 drop by mouth daily., Disp: , Rfl:    hydrocortisone (  ANUSOL-HC) 2.5 % rectal cream, Place 1 Application rectally 2 (two) times daily., Disp: 30 g, Rfl: 1   OVER THE COUNTER MEDICATION, Tums or Prilosec OTC as needed, Disp: , Rfl:    Prasterone, DHEA, (DHEA PO), Brain-E DHEA 1200-TG, Disp: , Rfl:    Probiotic Product (PROBIOTIC-10 PO), Take by mouth., Disp: , Rfl:    Digestive Enzymes (DIGESTIVE ENZYME PO), Take by mouth. (Patient not taking: Reported on 12/29/2021), Disp: ,  Rfl: \  Family History  Problem Relation Age of Onset   Heart failure Mother    Cancer Sister 63       Glioblastoma   Stomach cancer Paternal Grandfather    Breast cancer Neg Hx    Esophageal cancer Neg Hx      Social History   Tobacco Use   Smoking status: Never   Smokeless tobacco: Never  Vaping Use   Vaping status: Never Used  Substance Use Topics   Alcohol use: Yes    Alcohol/week: 9.0 standard drinks of alcohol    Types: 9 Glasses of wine per week    Comment: glass a night   Drug use: No    Allergies as of 02/09/2023 - Review Complete 02/09/2023  Allergen Reaction Noted   Penicillins Rash 05/09/2015    Review of Systems:    All systems reviewed and negative except where noted in HPI.   Physical Exam:  BP 125/80 (BP Location: Left Arm, Patient Position: Sitting, Cuff Size: Normal)   Pulse 87   Temp 97.8 F (36.6 C) (Oral)   Ht 5\' 5"  (1.651 m)   Wt 155 lb 4 oz (70.4 kg)   BMI 25.83 kg/m  No LMP recorded. Patient is postmenopausal.  General:   Alert,  Well-developed, well-nourished, pleasant and cooperative in NAD Head:  Normocephalic and atraumatic. Eyes:  Sclera clear, no icterus.   Conjunctiva pink. Ears:  Normal auditory acuity. Nose:  No deformity, discharge, or lesions. Mouth:  No deformity or lesions,oropharynx pink & moist. Neck:  Supple; no masses or thyromegaly. Lungs:  Respirations even and unlabored.  Clear throughout to auscultation.   No wheezes, crackles, or rhonchi. No acute distress. Heart:  Regular rate and rhythm; no murmurs, clicks, rubs, or gallops. Abdomen:  Normal bowel sounds. Soft, non-tender and non-distended without masses, hepatosplenomegaly or hernias noted.  No guarding or rebound tenderness.   Rectal: Normal perianal exam, mild localized tenderness in the posterior wall of anal canal Msk:  Symmetrical without gross deformities. Good, equal movement & strength bilaterally. Pulses:  Normal pulses noted. Extremities:  No  clubbing or edema.  No cyanosis. Neurologic:  Alert and oriented x3;  grossly normal neurologically. Skin:  Intact without significant lesions or rashes. No jaundice. Psych:  Alert and cooperative. Normal mood and affect.  Imaging Studies: No abdominal imaging  Assessment and Plan:   Shelby Griffith is a 67 y.o. female with no significant past medical history is seen in consultation for approximately 4 months history of rectal pain, discomfort after an episode of severe constipation.  Treated for anal fissure with diltiazem as well as for symptomatic hemorrhoids with hydrocortisone cream.  Currently, constipation has resolved and patient states that she is having regular bowel movements  Discussed with patient regarding outpatient hemorrhoid ligation.  However, she preferred not to undergo today because her husband is out of town and she has to stay alone at home today.  She would like to continue medical management of hemorrhoids and would like to schedule another visit  for hemorrhoid banding after her husband returns She will call our office to schedule hemorrhoid banding  Follow up as needed   Arlyss Repress, MD

## 2023-02-11 ENCOUNTER — Encounter: Payer: Self-pay | Admitting: Gastroenterology

## 2023-04-04 ENCOUNTER — Ambulatory Visit: Payer: Medicare HMO

## 2023-04-04 VITALS — Ht 65.0 in | Wt 155.0 lb

## 2023-04-04 DIAGNOSIS — Z0000 Encounter for general adult medical examination without abnormal findings: Secondary | ICD-10-CM | POA: Diagnosis not present

## 2023-04-04 NOTE — Progress Notes (Signed)
 Subjective:   Shelby Griffith is a 68 y.o. female who presents for Medicare Annual (Subsequent) preventive examination.  Visit Complete: Virtual I connected with  [name] on 04/04/23 by a audio enabled telemedicine application and verified that I am speaking with the correct person using two identifiers.  Patient Location: Home  Provider Location: Office/Clinic  I discussed the limitations of evaluation and management by telemedicine. The patient expressed understanding and agreed to proceed.  Vital Signs: Because this visit was a virtual/telehealth visit, some criteria may be missing or patient reported. Any vitals not documented were not able to be obtained and vitals that have been documented are patient reported.  Patient Medicare AWV questionnaire was completed by the patient on (not done); I have confirmed that all information answered by patient is correct and no changes since this date.  Cardiac Risk Factors include: advanced age (>91men, >82 women)    Objective:    Today's Vitals   04/04/23 1542  Weight: 155 lb (70.3 kg)  Height: 5' 5 (1.651 m)  PainSc: 0-No pain   Body mass index is 25.79 kg/m.     04/04/2023    3:53 PM 11/30/2022    4:30 PM 09/30/2021   11:29 AM 06/19/2020    9:14 AM 02/07/2020   11:00 AM 06/14/2017   12:42 PM 01/22/2016    2:16 PM  Advanced Directives  Does Patient Have a Medical Advance Directive? Yes No Yes Yes Yes No No  Type of Estate Agent of [place];Living will  Healthcare Power of [place];Living will Healthcare Power of [place];Living will Healthcare Power of [place];Living will    Does patient want to make changes to medical advance directive?    No - Patient declined     Copy of Healthcare Power of Attorney in Chart? No - copy requested  No - copy requested Yes - validated most recent copy scanned in chart (See row information) No - copy requested    Would patient like information on creating a  medical advance directive?      No - Patient declined     Current Medications (verified) Outpatient Encounter Medications as of 04/04/2023  Medication Sig   Cholecalciferol (VITAMIN D ) 10 MCG/ML LIQD Take 1 drop by mouth daily.   Digestive Enzymes (DIGESTIVE ENZYME PO) Take by mouth.   hydrocortisone  (ANUSOL -HC) 2.5 % rectal cream Place 1 Application rectally 2 (two) times daily.   OVER THE COUNTER MEDICATION Tums or Prilosec OTC as needed   Prasterone, DHEA, (DHEA PO) Brain-E DHEA 1200-TG   Probiotic Product (PROBIOTIC-10 PO) Take by mouth.   No facility-administered encounter medications on file as of 04/04/2023.    Allergies (verified) Penicillins   History: Past Medical History:  Diagnosis Date   Arthritis    left hip   GERD (gastroesophageal reflux disease)    occasional    Past Surgical History:  Procedure Laterality Date   CESAREAN SECTION     x 3   COLONOSCOPY WITH PROPOFOL  N/A 06/14/2017   Procedure: COLONOSCOPY WITH PROPOFOL ;  Surgeon: Unk Corinn Skiff, MD;  Location: [facility] ENDOSCOPY;  Service: Gastroenterology;  Laterality: N/A;   COLONOSCOPY WITH PROPOFOL  N/A 06/19/2020   Procedure: COLONOSCOPY WITH PROPOFOL ;  Surgeon: Unk Corinn Skiff, MD;  Location: [facility];  Service: Endoscopy;  Laterality: N/A;   ENDOMETRIAL ABLATION     ESOPHAGOGASTRODUODENOSCOPY (EGD) WITH PROPOFOL  N/A 02/07/2020   Procedure: ESOPHAGOGASTRODUODENOSCOPY (EGD) WITH PROPOFOL ;  Surgeon: Unk Corinn Skiff, MD;  Location: [facility] ENDOSCOPY;  Service:  Gastroenterology;  Laterality: N/A;   POLYPECTOMY  06/19/2020   Procedure: POLYPECTOMY;  Surgeon: Unk Corinn Skiff, MD;  Location: [facility];  Service: Endoscopy;;   THYROID  LOBECTOMY Right 1986   thyroid  lobectomy Right    TONSILLECTOMY  1977   VARICOSE VEIN SURGERY Bilateral    Family History  Problem Relation Age of Onset   Heart failure Mother    Cancer Sister 5       Glioblastoma   Stomach cancer Paternal  Grandfather    Breast cancer Neg Hx    Esophageal cancer Neg Hx    Social History   Socioeconomic History   Marital status: Married    Spouse name: Franky   Number of children: 3   Years of education: Not on file   Highest education level: Some college, no degree  Occupational History   Not on file  Tobacco Use   Smoking status: Never   Smokeless tobacco: Never  Vaping Use   Vaping status: Never Used  Substance and Sexual Activity   Alcohol use: Yes    Alcohol/week: 9.0 standard drinks of alcohol    Types: 9 Glasses of wine per week    Comment: glass a night   Drug use: No   Sexual activity: Yes    Birth control/protection: Post-menopausal  Other Topics Concern   Not on file  Social History Narrative   Shelby Griffith 38, Shelby Griffith 36, Shelby Griffith 32      Retired      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    Social Drivers of Corporate Investment Banker Strain: Low Risk  (04/04/2023)   Overall Financial Resource Strain (CARDIA)    Difficulty of Paying Living Expenses: Not hard at all  Food Insecurity: No Food Insecurity (04/04/2023)   Hunger Vital Sign    Worried About Running Out of Food in the Last Year: Never true    Ran Out of Food in the Last Year: Never true  Transportation Needs: No Transportation Needs (04/04/2023)   PRAPARE - Administrator, Civil Service (Medical): No    Lack of Transportation (Non-Medical): No  Physical Activity: Insufficiently Active (04/04/2023)   Exercise Vital Sign    Days of Exercise per Week: 3 days    Minutes of Exercise per Session: 30 min  Stress: No Stress Concern Present (04/04/2023)   [employer] of Occupational Health - Occupational Stress Questionnaire    Feeling of Stress : Not at all  Social Connections: Unknown (04/04/2023)   Social Connection and Isolation Panel [NHANES]    Frequency of Communication with Friends and Family: More than three times a week    Frequency of Social Gatherings with Friends and Family:  Twice a week    Attends Religious Services: Patient declined    Database Administrator or Organizations: Yes    Attends Engineer, Structural: More than 4 times per year    Marital Status: Married    Tobacco Counseling Counseling given: Not Answered  Clinical Intake:  Pre-visit preparation completed: Yes  Pain : No/denies pain Pain Score: 0-No pain    BMI - recorded: 25.79 Nutritional Status: BMI 25 -29 Overweight Nutritional Risks: None Diabetes: No  How often do you need to have someone help you when you read instructions, pamphlets, or other written materials from your doctor or pharmacy?: 1 - Never  Interpreter Needed?: No  Comments: lives with husband Information entered by :: B.Amariah Kierstead,LPN   Activities of Daily  Living    04/04/2023    3:54 PM 01/11/2023   11:10 AM  In your present state of health, do you have any difficulty performing the following activities:  Hearing? 0 0  Vision? 0 0  Difficulty concentrating or making decisions? 0 0  Walking or climbing stairs? 0 0  Dressing or bathing? 0 0  Doing errands, shopping? 0 0  Preparing Food and eating ? N N  Using the Toilet? N N  In the past six months, have you accidently leaked urine? N N  Do you have problems with loss of bowel control? N N  Managing your Medications? N N  Managing your Finances? N N  Housekeeping or managing your Housekeeping? N N    Patient Care Team: Wendee Lynwood HERO, NP as PCP - Diedre Sheldon Standing, MD as Consulting Physician (General Surgery) Unk Corinn Skiff, MD as Consulting Physician (Gastroenterology)  Indicate any recent Medical Services you may have received from other than [facility] providers in the past year (date may be approximate).     Assessment:   This is a routine wellness examination for [place].  Hearing/Vision screen Hearing Screening - Comments:: Pt says her hearing is good Vision Screening - Comments:: Pt has good vision w/readers Dr Laurice    Goals Addressed             This Visit's Progress    Patient Stated       04/04/23- Stay healthy, stay off medications, maybe lose a couple pounds       Depression Screen    04/04/2023    3:47 PM 12/29/2021    9:50 AM 09/30/2021   11:29 AM 12/25/2020    2:54 PM 11/07/2019    2:45 PM 03/14/2018    2:21 PM 12/06/2016    1:13 PM  PHQ 2/9 Scores  PHQ - 2 Score 0 0 0 0 0 0 0  PHQ- 9 Score  0    0     Fall Risk    04/04/2023    3:44 PM 01/11/2023   11:10 AM 12/29/2021    9:51 AM 09/30/2021   11:29 AM 12/25/2020    9:38 AM  Fall Risk   Falls in the past year? 0 0 0 0 0  Number falls in past yr: 0  0 0 0  Injury with Fall? 0   0 0  Risk for fall due to : No Fall Risks   No Fall Risks   Follow up Education provided;Falls prevention discussed   Falls evaluation completed;Education provided;Falls prevention discussed     MEDICARE RISK AT HOME: Medicare Risk at Home Any stairs in or around the home?: Yes If so, are there any without handrails?: Yes Home free of loose throw rugs in walkways, pet beds, electrical cords, etc?: Yes Adequate lighting in your home to reduce risk of falls?: Yes Life alert?: No Use of a cane, walker or w/c?: No Grab bars in the bathroom?: No Shower chair or bench in shower?: Yes Elevated toilet seat or a handicapped toilet?: Yes  TIMED UP AND GO:  Was the test performed?  No    Cognitive Function:        04/04/2023    3:54 PM 09/30/2021   11:30 AM  6CIT Screen  What Year? 0 points 0 points  What month? 0 points 0 points  What time? 0 points 0 points  Count back from 20 0 points 0 points  Months in reverse 0 points 0  points  Repeat phrase 0 points 2 points  Total Score 0 points 2 points    Immunizations Immunization History  Administered Date(s) Administered   Fluad Quad(high Dose 65+) 12/29/2021   Fluad Trivalent(High Dose 65+) 12/31/2022   Hepatitis A 02/07/2009, 08/19/2009   Hepatitis B 02/07/2009, 03/18/2009, 08/19/2009    Influenza,inj,Quad PF,6+ Mos 03/12/2016, 01/18/2017, 01/26/2018, 12/15/2018, 01/23/2020, 12/25/2020   Influenza-Unspecified 12/27/2013   MMR 03/05/2009   PFIZER(Purple Top)SARS-COV-2 Vaccination 06/17/2019, 07/08/2019, 02/16/2020   Tdap 03/05/2009    TDAP status: Up to date  Flu Vaccine status: Up to date  Pneumococcal vaccine status: Due, Education has been provided regarding the importance of this vaccine. Advised may receive this vaccine at local pharmacy or Health Dept. Aware to provide a copy of the vaccination record if obtained from local pharmacy or Health Dept. Verbalized acceptance and understanding.  Covid-19 vaccine status: Completed vaccines  Qualifies for Shingles Vaccine? Yes   Zostavax completed No   Shingrix Completed?: No.    Education has been provided regarding the importance of this vaccine. Patient has been advised to call insurance company to determine out of pocket expense if they have not yet received this vaccine. Advised may also receive vaccine at local pharmacy or Health Dept. Verbalized acceptance and understanding.  Screening Tests Health Maintenance  Topic Date Due   DTaP/Tdap/Td (2 - Td or Tdap) 04/03/2024 (Originally 03/06/2019)   Pneumonia Vaccine 91+ Years old (1 of 1 - PCV) 04/03/2024 (Originally 10/29/2019)   MAMMOGRAM  09/01/2023   Medicare Annual Wellness (AWV)  04/03/2024   Colonoscopy  06/19/2025   INFLUENZA VACCINE  Completed   DEXA SCAN  Completed   Hepatitis C Screening  Addressed   HPV VACCINES  Aged Out   COVID-19 Vaccine  Discontinued   Zoster Vaccines- Shingrix  Discontinued    Health Maintenance  There are no preventive care reminders to display for this patient.   Colorectal cancer screening: Type of screening: Colonoscopy. Completed 06/19/2020. Repeat every 5 years  Mammogram status: Completed 09/01/22. Repeat every year  Bone Density status: Completed 02/11/2021. Results reflect: Bone density results: NORMAL. Repeat every 5  years.  Lung Cancer Screening: (Low Dose CT Chest recommended if Age 33-80 years, 20 pack-year currently smoking OR have quit w/in 15years.) does not qualify.   Lung Cancer Screening Referral: no  Additional Screening:  Hepatitis C Screening: does not qualify; Completed 05/16/2014  Vision Screening: Recommended annual ophthalmology exams for early detection of glaucoma and other disorders of the eye. Is the patient up to date with their annual eye exam?  Yes  Who is the provider or what is the name of the office in which the patient attends annual eye exams? Dr Laurice If pt is not established with a provider, would they like to be referred to a provider to establish care? No .   Dental Screening: Recommended annual dental exams for proper oral hygiene  Diabetic Foot Exam: n/a  Community Resource Referral / Chronic Care Management: CRR required this visit?  No   CCM required this visit?  No    Plan:     I have personally reviewed and noted the following in the patient's chart:   Medical and social history Use of alcohol, tobacco or illicit drugs  Current medications and supplements including opioid prescriptions. Patient is not currently taking opioid prescriptions. Functional ability and status Nutritional status Physical activity Advanced directives List of other physicians Hospitalizations, surgeries, and ER visits in previous 12 months Vitals Screenings to  include cognitive, depression, and falls Referrals and appointments  In addition, I have reviewed and discussed with patient certain preventive protocols, quality metrics, and best practice recommendations. A written personalized care plan for preventive services as well as general preventive health recommendations were provided to patient.    Erminio LITTIE Saris, LPN   10/29/7972   After Visit Summary: (MyChart) Due to this being a telephonic visit, the after visit summary with patients personalized plan was offered to  patient via MyChart   Nurse Notes: The patient states she is doing well and has no concerns or questions at this time.

## 2023-04-04 NOTE — Patient Instructions (Signed)
 Shelby Griffith , Thank you for taking time to come for your Medicare Wellness Visit. I appreciate your ongoing commitment to your health goals. Please review the following plan we discussed and let me know if I can assist you in the future.   Referrals/Orders/Follow-Ups/Clinician Recommendations: none  This is a list of the screening recommended for you and due dates:  Health Maintenance  Topic Date Due   DTaP/Tdap/Td vaccine (2 - Td or Tdap) 03/06/2019   Pneumonia Vaccine (1 of 1 - PCV) Never done   Mammogram  09/01/2023   Medicare Annual Wellness Visit  04/03/2024   Colon Cancer Screening  06/19/2025   Flu Shot  Completed   DEXA scan (bone density measurement)  Completed   Hepatitis C Screening  Addressed   HPV Vaccine  Aged Out   COVID-19 Vaccine  Discontinued   Zoster (Shingles) Vaccine  Discontinued    Advanced directives: (Copy Requested) Please bring a copy of your health care power of attorney and living will to the office to be added to your chart at your convenience.  Next Medicare Annual Wellness Visit scheduled for next year: Yes 04/04/2024 @ 3:40pm televisit

## 2023-05-13 ENCOUNTER — Ambulatory Visit: Payer: Medicare HMO | Admitting: Family Medicine

## 2023-05-13 ENCOUNTER — Encounter: Payer: Self-pay | Admitting: Family Medicine

## 2023-05-13 VITALS — BP 136/84 | HR 80 | Temp 97.9°F | Ht 65.0 in | Wt 155.5 lb

## 2023-05-13 DIAGNOSIS — R519 Headache, unspecified: Secondary | ICD-10-CM | POA: Insufficient documentation

## 2023-05-13 LAB — CBC WITH DIFFERENTIAL/PLATELET
Basophils Absolute: 0 10*3/uL (ref 0.0–0.1)
Basophils Relative: 1 % (ref 0.0–3.0)
Eosinophils Absolute: 0.2 10*3/uL (ref 0.0–0.7)
Eosinophils Relative: 3.6 % (ref 0.0–5.0)
HCT: 40.1 % (ref 36.0–46.0)
Hemoglobin: 13.5 g/dL (ref 12.0–15.0)
Lymphocytes Relative: 34.7 % (ref 12.0–46.0)
Lymphs Abs: 1.7 10*3/uL (ref 0.7–4.0)
MCHC: 33.7 g/dL (ref 30.0–36.0)
MCV: 95.5 fL (ref 78.0–100.0)
Monocytes Absolute: 0.6 10*3/uL (ref 0.1–1.0)
Monocytes Relative: 11.6 % (ref 3.0–12.0)
Neutro Abs: 2.4 10*3/uL (ref 1.4–7.7)
Neutrophils Relative %: 49.1 % (ref 43.0–77.0)
Platelets: 224 10*3/uL (ref 150.0–400.0)
RBC: 4.19 Mil/uL (ref 3.87–5.11)
RDW: 13.3 % (ref 11.5–15.5)
WBC: 4.8 10*3/uL (ref 4.0–10.5)

## 2023-05-13 LAB — C-REACTIVE PROTEIN: CRP: <1 mg/dL (ref 0.5–20.0)

## 2023-05-13 LAB — SEDIMENTATION RATE: Sed Rate: 21 mm/h (ref 0–30)

## 2023-05-13 NOTE — Progress Notes (Signed)
Subjective:    Patient ID: Shelby Griffith, female    DOB: [birth year], 68 y.o.   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   In late December had dental work/ crown In early January- ache in the left occiput area  Went back to the dentist -had a good chest   Still feels a little achiness around gum line  Did not do xray    Sister -gliobastoma -- dx at 74 yo and died    Headache is daily  Takes 2 advil daily    Left occiput  Dull  Not throbbing  No radiation  Jaw movement does not make it worse   No triggers that she knows of  Able to exercise   No light sensitivity  No sound sensitivity  No nausea      Teeth are not sensitive  No root canal in new crown tooth     Has a titanium drill bit stuck in lower tooth- years  Not a problem     2 cups coffee am- half caf      Patient Active Problem List   Diagnosis Date Noted   Occipital headache 05/13/2023   Rectal pain 11/17/2022   Anal fissure 10/08/2022   H/O partial thyroidectomy 12/29/2021   Overweight 12/29/2021   Vitamin D deficiency 12/25/2020   Preventative health care 12/25/2020   Nonintractable headache 09/10/2020   History of colonic polyps    Thyroid nodule 03/13/2020   Foreign body in esophagus    OA (osteoarthritis) of hip 11/07/2019   GERD (gastroesophageal reflux disease) 11/07/2019   Past Medical History:  Diagnosis Date   Arthritis    left hip   GERD (gastroesophageal reflux disease)    occasional    Past Surgical History:  Procedure Laterality Date   CESAREAN SECTION     x 3   COLONOSCOPY WITH PROPOFOL N/A 06/14/2017   Procedure: COLONOSCOPY WITH PROPOFOL;  Surgeon: Toney Reil, MD;  Location: [facility]  ENDOSCOPY;  Service: Gastroenterology;  Laterality: N/A;   COLONOSCOPY WITH PROPOFOL N/A 06/19/2020   Procedure: COLONOSCOPY WITH PROPOFOL;  Surgeon: Toney Reil, MD;  Location: [facility];  Service: Endoscopy;  Laterality: N/A;   ENDOMETRIAL ABLATION     ESOPHAGOGASTRODUODENOSCOPY (EGD) WITH PROPOFOL N/A 02/07/2020   Procedure: ESOPHAGOGASTRODUODENOSCOPY (EGD) WITH PROPOFOL;  Surgeon: Toney Reil, MD;  Location: [facility] ENDOSCOPY;  Service: Gastroenterology;  Laterality: N/A;   POLYPECTOMY  06/19/2020   Procedure: POLYPECTOMY;  Surgeon: Toney Reil, MD;  Location: [facility];  Service: Endoscopy;;   THYROID LOBECTOMY Right 1986   thyroid lobectomy Right    TONSILLECTOMY  1977   VARICOSE VEIN SURGERY Bilateral    Social History   Tobacco Use   Smoking status: Never   Smokeless tobacco: Never  Vaping Use   Vaping status: Never Used  Substance Use Topics   Alcohol use: Yes    Alcohol/week: 9.0 standard drinks of alcohol    Types: 9 Glasses of wine per week    Comment: glass a night   Drug use: No   Family History  Problem Relation Age of Onset  Heart failure Mother    Cancer Sister 80       Glioblastoma   Stomach cancer Paternal Grandfather    Breast cancer Neg Hx    Esophageal cancer Neg Hx    Allergies  Allergen Reactions   Penicillins Rash   Current Outpatient Medications on File Prior to Visit  Medication Sig Dispense Refill   Cholecalciferol (VITAMIN D) 10 MCG/ML LIQD Take 1 drop by mouth daily.     Digestive Enzymes (DIGESTIVE ENZYME PO) Take by mouth.     hydrocortisone (ANUSOL-HC) 2.5 % rectal cream Place 1 Application rectally 2 (two) times daily. (Patient taking differently: Place 1 Application rectally daily as needed.) 30 g 1   OVER THE COUNTER MEDICATION Tums or Prilosec OTC as needed     Prasterone, DHEA, (DHEA PO) Brain-E DHEA 1200-TG     Probiotic Product (PROBIOTIC-10 PO) Take by mouth.     No current  facility-administered medications on file prior to visit.    Review of Systems  Constitutional:  Negative for activity change, appetite change, fatigue, fever and unexpected weight change.  HENT:  Negative for congestion, ear pain, rhinorrhea, sinus pressure and sore throat.        Gums feel funny on L above the new crown  No TMJ pain   Eyes:  Negative for pain, redness and visual disturbance.  Respiratory:  Negative for cough, shortness of breath and wheezing.   Cardiovascular:  Negative for chest pain and palpitations.  Gastrointestinal:  Negative for abdominal pain, blood in stool, constipation and diarrhea.  Endocrine: Negative for polydipsia and polyuria.  Genitourinary:  Negative for dysuria, frequency and urgency.  Musculoskeletal:  Negative for arthralgias, back pain and myalgias.  Skin:  Negative for pallor and rash.  Allergic/Immunologic: Negative for environmental allergies.  Neurological:  Positive for headaches. Negative for dizziness, tremors, seizures, syncope, facial asymmetry, speech difficulty, weakness, light-headedness and numbness.  Hematological:  Negative for adenopathy. Does not bruise/bleed easily.  Psychiatric/Behavioral:  Negative for decreased concentration and dysphoric mood. The patient is not nervous/anxious.        Objective:   Physical Exam Constitutional:      General: She is not in acute distress.    Appearance: Normal appearance. She is well-developed and normal weight. She is not ill-appearing or diaphoretic.  HENT:     Head: Normocephalic and atraumatic.     Right Ear: External ear normal.     Left Ear: External ear normal.     Nose: Nose normal.     Mouth/Throat:     Pharynx: No oropharyngeal exudate.  Eyes:     General: No scleral icterus.       Right eye: No discharge.        Left eye: No discharge.     Conjunctiva/sclera: Conjunctivae normal.     Pupils: Pupils are equal, round, and reactive to light.     Comments: No nystagmus   Neck:     Thyroid: No thyromegaly.     Vascular: No carotid bruit or JVD.     Trachea: No tracheal deviation.     Comments: Some general tightness of cervical muscles worse on left   Some mild tenderness around left occiput w/o swelling or skin change  Cardiovascular:     Rate and Rhythm: Normal rate and regular rhythm.     Heart sounds: Normal heart sounds. No murmur heard. Pulmonary:     Effort: Pulmonary effort is normal. No respiratory distress.     Breath  sounds: Normal breath sounds. No wheezing or rales.  Abdominal:     General: Bowel sounds are normal. There is no distension.     Palpations: Abdomen is soft. There is no mass.     Tenderness: There is no abdominal tenderness.  Musculoskeletal:        General: No tenderness.     Cervical back: Full passive range of motion without pain, normal range of motion and neck supple.  Lymphadenopathy:     Cervical: No cervical adenopathy.  Skin:    General: Skin is warm and dry.     Coloration: Skin is not pale.     Findings: No rash.  Neurological:     General: No focal deficit present.     Mental Status: She is alert and oriented to person, place, and time.     Cranial Nerves: No cranial nerve deficit, dysarthria or facial asymmetry.     Sensory: No sensory deficit.     Motor: No weakness, tremor, atrophy, abnormal muscle tone, seizure activity or pronator drift.     Coordination: Romberg sign negative. Coordination normal. Finger-Nose-Finger Test normal.     Gait: Gait normal.     Deep Tendon Reflexes: Reflexes are normal and symmetric. Reflexes normal.     Comments: No focal cerebellar signs   Psychiatric:        Behavior: Behavior normal.        Thought Content: Thought content normal.           Assessment & Plan:   Problem List Items Addressed This Visit       Other   Occipital headache - Primary   New type of headache (different from past) in 68 year old  This did follow dental work  In right occiput/ dull   Not severe but requires ibuprofen  No known triggers but does have neck tension   In light of age esr and crp ordered with cbc to r/o TA Also Ct head   Discussed neuro changes to watch for  Also lifestyle changes for headache Recommend use of cold compress when able Also gentle neck stretches  Pending results  Call back and Er precautions noted in detail today        Relevant Orders   CT HEAD WO CONTRAST ( )   Nonintractable headache   Reviewed old records  Today's headache seems different / is focally in left occiput       Relevant Orders   Sedimentation Rate (Completed)   C-reactive protein (Completed)   CBC with Differential/Platelet (Completed)   CT HEAD WO CONTRAST ( )

## 2023-05-13 NOTE — Patient Instructions (Addendum)
Use cold compress on the area of pain - 10 minutes at a time  Stay very hydrated    Lab today to rule out temporal arteritis   I put the referral in for a CT scan  Please let us know if you don't hear in the next week    If headache worsens please give [place] a call

## 2023-05-14 NOTE — Assessment & Plan Note (Signed)
New type of headache (different from past) in 68 year old  This did follow dental work  In right occiput/ dull  Not severe but requires ibuprofen  No known triggers but does have neck tension   In light of age esr and crp ordered with cbc to r/o TA Also Ct head   Discussed neuro changes to watch for  Also lifestyle changes for headache Recommend use of cold compress when able Also gentle neck stretches  Pending results  Call back and Er precautions noted in detail today

## 2023-05-14 NOTE — Assessment & Plan Note (Signed)
Reviewed old records  Today's headache seems different / is focally in left occiput

## 2023-05-16 ENCOUNTER — Other Ambulatory Visit: Payer: Self-pay | Admitting: Family Medicine

## 2023-05-19 ENCOUNTER — Encounter: Payer: Self-pay | Admitting: *Deleted

## 2023-05-19 ENCOUNTER — Telehealth: Payer: Self-pay | Admitting: *Deleted

## 2023-05-19 NOTE — Telephone Encounter (Signed)
This is the STAT that I discussed with you last Friday that was put in after I was gone for the day. It was requested that this be put in the STAT chat so that it could be handled. It does not look like anyone sent a request to the STAT Chat so it was not worked on until I returned on Monday 05/17/23.  I was able to get this approved on Monday 05/17/2023, [facility] [place] has the order and was waiting on Auth information to call and schedule the patient. They must not have gotten around to calling them yet.   The patient can reach out to them directly to schedule.   P: [id number]

## 2023-05-19 NOTE — Telephone Encounter (Signed)
Will route to referral dpt to f/u with STAT CT that was ordered last week

## 2023-05-19 NOTE — Telephone Encounter (Signed)
Copied from CRM [id number]. Topic: General - Other >> May 19, 2023  2:28 PM Kathryne Eriksson wrote: Reason for CRM: CT Scheduling >> May 19, 2023  2:31 PM Kathryne Eriksson wrote: Patient states she was advised by Dr. Milinda Antis that if she hadn't heard anything back in regards to CT scheduling at [facility] then to give the office a call back. Patient is requesting a call back at [phone]

## 2023-05-20 NOTE — Telephone Encounter (Signed)
 Pt has appt scheduled.

## 2023-05-25 ENCOUNTER — Ambulatory Visit
Admission: RE | Admit: 2023-05-25 | Discharge: 2023-05-25 | Disposition: A | Discharge status: Home / Self Care | Payer: Medicare HMO | Source: Ambulatory Visit | Attending: Family Medicine | Admitting: Family Medicine

## 2023-05-25 ENCOUNTER — Encounter: Payer: Self-pay | Admitting: Family Medicine

## 2023-05-25 DIAGNOSIS — R519 Headache, unspecified: Secondary | ICD-10-CM

## 2023-05-25 DIAGNOSIS — K219 Gastro-esophageal reflux disease without esophagitis: Secondary | ICD-10-CM | POA: Diagnosis not present

## 2023-06-14 ENCOUNTER — Encounter: Payer: Self-pay | Admitting: Nurse Practitioner

## 2023-06-14 DIAGNOSIS — R350 Frequency of micturition: Secondary | ICD-10-CM | POA: Diagnosis not present

## 2023-06-14 DIAGNOSIS — Z1624 Resistance to multiple antibiotics: Secondary | ICD-10-CM | POA: Diagnosis not present

## 2023-07-21 ENCOUNTER — Other Ambulatory Visit: Payer: Self-pay | Admitting: Nurse Practitioner

## 2023-07-21 DIAGNOSIS — Z13820 Encounter for screening for osteoporosis: Secondary | ICD-10-CM

## 2023-07-21 DIAGNOSIS — Z1231 Encounter for screening mammogram for malignant neoplasm of breast: Secondary | ICD-10-CM

## 2023-08-30 DIAGNOSIS — L821 Other seborrheic keratosis: Secondary | ICD-10-CM | POA: Diagnosis not present

## 2023-08-30 DIAGNOSIS — D225 Melanocytic nevi of trunk: Secondary | ICD-10-CM | POA: Diagnosis not present

## 2023-08-30 DIAGNOSIS — L814 Other melanin hyperpigmentation: Secondary | ICD-10-CM | POA: Diagnosis not present

## 2023-09-06 ENCOUNTER — Ambulatory Visit
Admission: RE | Admit: 2023-09-06 | Discharge: 2023-09-06 | Disposition: A | Discharge status: Home / Self Care | Source: Ambulatory Visit | Attending: Nurse Practitioner | Admitting: Nurse Practitioner

## 2023-09-06 DIAGNOSIS — M8589 Other specified disorders of bone density and structure, multiple sites: Secondary | ICD-10-CM | POA: Insufficient documentation

## 2023-09-06 DIAGNOSIS — Z13820 Encounter for screening for osteoporosis: Secondary | ICD-10-CM

## 2023-09-06 DIAGNOSIS — Z1231 Encounter for screening mammogram for malignant neoplasm of breast: Secondary | ICD-10-CM | POA: Diagnosis not present

## 2023-09-06 DIAGNOSIS — Z780 Asymptomatic menopausal state: Secondary | ICD-10-CM | POA: Insufficient documentation

## 2023-09-07 ENCOUNTER — Ambulatory Visit: Payer: Self-pay | Admitting: Nurse Practitioner

## 2023-09-09 ENCOUNTER — Ambulatory Visit: Payer: Self-pay | Admitting: Nurse Practitioner

## 2023-12-07 ENCOUNTER — Encounter: Payer: Self-pay | Admitting: Family Medicine

## 2023-12-07 ENCOUNTER — Ambulatory Visit (INDEPENDENT_AMBULATORY_CARE_PROVIDER_SITE_OTHER): Admitting: Family Medicine

## 2023-12-07 VITALS — BP 136/68 | HR 73 | Temp 98.5°F | Ht 65.0 in | Wt 157.4 lb

## 2023-12-07 DIAGNOSIS — K1379 Other lesions of oral mucosa: Secondary | ICD-10-CM

## 2023-12-07 NOTE — Assessment & Plan Note (Signed)
 Acute, asymptomatic, small No sign of associated infection.  Not at area where salivary gland could be blocked. No lymphadenopathy the in head and neck noted. Patient non-smoker, no tobacco use.  Patient reassured that lesion will improve over time with no treatment.   No need to treat otherwise unless becoming significantly larger or irritated.   Encouraged patient to not recurrently touch the area to avoid irritation. If area does get more irritated she can consider seeing her dentist for possible steroid injection versus surgical removal.

## 2023-12-07 NOTE — Progress Notes (Signed)
 Patient ID: Shelby Griffith, female    DOB: 06/26/1955, 68 y.o.   MRN: [id number]  This visit was conducted in person.  BP 136/68   Pulse 73   Temp 98.5 F (36.9 C) (Oral)   Ht 5' 5 (1.651 m)   Wt 157 lb 6.4 oz (71.4 kg)   SpO2 97%   BMI 26.19 kg/m    CC:  Chief Complaint  Patient presents with   Mass    Inside loiwe lip noticed it early August    Subjective:   HPI: Shelby Griffith is a 68 y.o. female presenting on 12/07/2023 for Mass (Inside loiwe lip noticed it early August)   New onset mass inside lower lip.SABRA ongoing x 1 month.  No known mouth injury.  Non tender.  No change in size.  No discharge.  Feeling well over all.   Wears retainers at night... not new.    Nonsmoker, no dip or chem history.  Relevant past medical, surgical, family and social history reviewed and updated as indicated. Interim medical history since our last visit reviewed. Allergies and medications reviewed and updated. Outpatient Medications Prior to Visit  Medication Sig Dispense Refill   Cholecalciferol (VITAMIN D ) 10 MCG/ML LIQD Take 1 drop by mouth daily.     Digestive Enzymes (DIGESTIVE ENZYME PO) Take by mouth.     hydrocortisone  (ANUSOL -HC) 2.5 % rectal cream Place 1 Application rectally 2 (two) times daily. (Patient taking differently: Place 1 Application rectally daily as needed.) 30 g 1   OVER THE COUNTER MEDICATION Tums or Prilosec OTC as needed     Prasterone, DHEA, (DHEA PO) Brain-E DHEA 1200-TG     Probiotic Product (PROBIOTIC-10 PO) Take by mouth.     No facility-administered medications prior to visit.     Per HPI unless specifically indicated in ROS section below Review of Systems Objective:  BP 136/68   Pulse 73   Temp 98.5 F (36.9 C) (Oral)   Ht 5' 5 (1.651 m)   Wt 157 lb 6.4 oz (71.4 kg)   SpO2 97%   BMI 26.19 kg/m   Wt Readings from Last 3 Encounters:  12/07/23 157 lb 6.4 oz (71.4 kg)  05/13/23 155 lb 8 oz (70.5 kg)  04/04/23 155 lb (70.3 kg)       Physical Exam    Results for orders placed or performed in visit on 05/13/23  Sedimentation Rate   Collection Time: 05/13/23 12:13 PM  Result Value Ref Range   Sed Rate 21 0 - 30 mm/hr  C-reactive protein   Collection Time: 05/13/23 12:13 PM  Result Value Ref Range   CRP <1.0 0.5 - 20.0 mg/dL  CBC with Differential/Platelet   Collection Time: 05/13/23 12:13 PM  Result Value Ref Range   WBC 4.8 4.0 - 10.5 K/uL   RBC 4.19 3.87 - 5.11 Mil/uL   Hemoglobin 13.5 12.0 - 15.0 g/dL   HCT 59.8 63.9 - 53.9 %   MCV 95.5 78.0 - 100.0 fl   MCHC 33.7 30.0 - 36.0 g/dL   RDW 86.6 88.4 - 84.4 %   Platelets 224.0 150.0 - 400.0 K/uL   Neutrophils Relative % 49.1 43.0 - 77.0 %   Lymphocytes Relative 34.7 12.0 - 46.0 %   Monocytes Relative 11.6 3.0 - 12.0 %   Eosinophils Relative 3.6 0.0 - 5.0 %   Basophils Relative 1.0 0.0 - 3.0 %   Neutro Abs 2.4 1.4 - 7.7 K/uL   Lymphs Abs 1.7 0.7 -  4.0 K/uL   Monocytes Absolute 0.6 0.1 - 1.0 K/uL   Eosinophils Absolute 0.2 0.0 - 0.7 K/uL   Basophils Absolute 0.0 0.0 - 0.1 K/uL    Assessment and Plan  Mucocele of mouth Assessment & Plan:  Acute, asymptomatic, small No sign of associated infection.  Not at area where salivary gland could be blocked. No lymphadenopathy the in head and neck noted. Patient non-smoker, no tobacco use.  Patient reassured that lesion will improve over time with no treatment.   No need to treat otherwise unless becoming significantly larger or irritated.   Encouraged patient to not recurrently touch the area to avoid irritation. If area does get more irritated she can consider seeing her dentist for possible steroid injection versus surgical removal.     No follow-ups on file.   Greig Ring, MD

## 2024-01-02 ENCOUNTER — Encounter: Payer: Medicare HMO | Admitting: Nurse Practitioner

## 2024-01-11 ENCOUNTER — Encounter: Admitting: Nurse Practitioner

## 2024-01-11 ENCOUNTER — Encounter: Payer: Self-pay | Admitting: Nurse Practitioner

## 2024-01-18 DIAGNOSIS — D2261 Melanocytic nevi of right upper limb, including shoulder: Secondary | ICD-10-CM | POA: Diagnosis not present

## 2024-01-18 DIAGNOSIS — D2272 Melanocytic nevi of left lower limb, including hip: Secondary | ICD-10-CM | POA: Diagnosis not present

## 2024-01-18 DIAGNOSIS — D225 Melanocytic nevi of trunk: Secondary | ICD-10-CM | POA: Diagnosis not present

## 2024-01-18 DIAGNOSIS — D2271 Melanocytic nevi of right lower limb, including hip: Secondary | ICD-10-CM | POA: Diagnosis not present

## 2024-01-18 DIAGNOSIS — L821 Other seborrheic keratosis: Secondary | ICD-10-CM | POA: Diagnosis not present

## 2024-01-18 DIAGNOSIS — D2262 Melanocytic nevi of left upper limb, including shoulder: Secondary | ICD-10-CM | POA: Diagnosis not present

## 2024-02-27 ENCOUNTER — Encounter: Admitting: Nurse Practitioner

## 2024-02-29 DIAGNOSIS — L988 Other specified disorders of the skin and subcutaneous tissue: Secondary | ICD-10-CM | POA: Diagnosis not present

## 2024-04-11 ENCOUNTER — Ambulatory Visit (INDEPENDENT_AMBULATORY_CARE_PROVIDER_SITE_OTHER)

## 2024-04-11 VITALS — Ht 65.0 in | Wt 152.0 lb

## 2024-04-11 DIAGNOSIS — Z0000 Encounter for general adult medical examination without abnormal findings: Secondary | ICD-10-CM

## 2024-04-11 NOTE — Progress Notes (Signed)
 "  Chief Complaint  Patient presents with   Medicare Wellness     Subjective:   Shelby Griffith is a 69 y.o. female who presents for a Medicare Annual Wellness Visit.  Visit info / Clinical Intake: Medicare Wellness Visit Type:: Subsequent Annual Wellness Visit Persons participating in visit and providing information:: patient Medicare Wellness Visit Mode:: Telephone If telephone:: video declined Since this visit was completed virtually, some vitals may be partially provided or unavailable. Missing vitals are due to the limitations of the virtual format.: Unable to obtain vitals - no equipment If Telephone or Video please confirm:: I connected with patient using audio/video enable telemedicine. I verified patient identity with two identifiers, discussed telehealth limitations, and patient agreed to proceed. Patient Location:: home Provider Location:: office Interpreter Needed?: No Pre-visit prep was completed: yes AWV questionnaire completed by patient prior to visit?: no Living arrangements:: lives with spouse/significant other Patient's Overall Health Status Rating: excellent Typical amount of pain: none Does pain affect daily life?: no Are you currently prescribed opioids?: no  Dietary Habits and Nutritional Risks How many meals a day?: 3 Eats fruit and vegetables daily?: yes Most meals are obtained by: preparing own meals; eating out (mix of both) In the last 2 weeks, have you had any of the following?: none Diabetic:: no  Functional Status Activities of Daily Living (to include ambulation/medication): Independent Ambulation: Independent Medication Administration: Independent Home Management (perform basic housework or laundry): Independent Manage your own finances?: yes Primary transportation is: driving Concerns about vision?: no *vision screening is required for WTM* Concerns about hearing?: no  Fall Screening Falls in the past year?: 0 Number of falls in past  year: 0 Was there an injury with Fall?: 0 Fall Risk Category Calculator: 0 Patient Fall Risk Level: Low Fall Risk  Fall Risk Patient at Risk for Falls Due to: No Fall Risks Fall risk Follow up: Falls evaluation completed; Education provided; Falls prevention discussed  Home and Transportation Safety: All rugs have non-skid backing?: yes All stairs or steps have railings?: yes Grab bars in the bathtub or shower?: (!) no Have non-skid surface in bathtub or shower?: yes Good home lighting?: yes Regular seat belt use?: yes Hospital stays in the last year:: no  Cognitive Assessment Difficulty concentrating, remembering, or making decisions? : no Will 6CIT or Mini Cog be Completed: yes What year is it?: 0 points What month is it?: 0 points Give patient an address phrase to remember (5 components): [address] [place]  About what time is it?: 0 points Count backwards from 20 to 1: 0 points Say the months of the year in reverse: 0 points Repeat the address phrase from earlier: 0 points 6 CIT Score: 0 points  Advance Directives (For Healthcare) Does Patient Have a Medical Advance Directive?: Yes Does patient want to make changes to medical advance directive?: No - Patient declined Type of Advance Directive: Healthcare Power of Shelby Griffith; Living will Copy of Healthcare Power of Attorney in Chart?: No - copy requested Copy of Living Will in Chart?: No - copy requested  Reviewed/Updated  Reviewed/Updated: Reviewed All (Medical, Surgical, Family, Medications, Allergies, Care Teams, Patient Goals)    Allergies (verified) Penicillins   Current Medications (verified) Outpatient Encounter Medications as of 04/11/2024  Medication Sig   Cholecalciferol (VITAMIN D ) 10 MCG/ML LIQD Take 1 drop by mouth daily.   Digestive Enzymes (DIGESTIVE ENZYME PO) Take by mouth.   hydrocortisone  (ANUSOL -HC) 2.5 % rectal cream Place 1 Application rectally 2 (two) times daily. (  Patient taking  differently: Place 1 Application rectally daily as needed.)   OVER THE COUNTER MEDICATION Tums or Prilosec OTC as needed   Prasterone, DHEA, (DHEA PO) Brain-E DHEA 1200-TG   Probiotic Product (PROBIOTIC-10 PO) Take by mouth.   No facility-administered encounter medications on file as of 04/11/2024.    History: Past Medical History:  Diagnosis Date   Allergy    Arthritis    left hip   GERD (gastroesophageal reflux disease)    occasional    Past Surgical History:  Procedure Laterality Date   CESAREAN SECTION     x 3   COLONOSCOPY WITH PROPOFOL  N/A 06/14/2017   Procedure: COLONOSCOPY WITH PROPOFOL ;  Surgeon: Unk Corinn Skiff, MD;  Location: [facility];  Service: Gastroenterology;  Laterality: N/A;   COLONOSCOPY WITH PROPOFOL  N/A 06/19/2020   Procedure: COLONOSCOPY WITH PROPOFOL ;  Surgeon: Unk Corinn Skiff, MD;  Location: [facility];  Service: Endoscopy;  Laterality: N/A;   ENDOMETRIAL ABLATION     ESOPHAGOGASTRODUODENOSCOPY (EGD) WITH PROPOFOL  N/A 02/07/2020   Procedure: ESOPHAGOGASTRODUODENOSCOPY (EGD) WITH PROPOFOL ;  Surgeon: Unk Corinn Skiff, MD;  Location: [facility] END[facility];  Service: Gastroenterology;  Laterality: N/A;   POLYPECTOMY  06/19/2020   Procedure: POLYPECTOMY;  Surgeon: Unk Corinn Skiff, MD;  Location: [facility];  Service: Endoscopy;;   THYROID  LOBECTOMY Right 1986   thyroid  lobectomy Right    TONSILLECTOMY  1977   TUBAL LIGATION  1990   VARICOSE VEIN SURGERY Bilateral    Family History  Problem Relation Age of Onset   Heart failure Mother    Cancer Sister 18       Glioblastoma   Stomach cancer Paternal Grandfather    Breast cancer Neg Hx    Esophageal cancer Neg Hx    Social History   Occupational History   Not on file  Tobacco Use   Smoking status: Never   Smokeless tobacco: Never  Vaping Use   Vaping status: Never Used  Substance and Sexual Activity   Alcohol use: Yes    Alcohol/week: 9.0 standard drinks of alcohol     Types: 9 Glasses of wine per week    Comment: glass a night   Drug use: No   Sexual activity: Yes    Birth control/protection: Post-menopausal   Tobacco Counseling Counseling given: Not Answered  SDOH Screenings   Food Insecurity: No Food Insecurity (04/11/2024)  Housing: Low Risk (04/11/2024)  Transportation Needs: No Transportation Needs (04/11/2024)  Utilities: Not At Risk (04/11/2024)  Alcohol Screen: Low Risk (12/06/2023)  Depression (PHQ2-9): Low Risk (04/11/2024)  Financial Resource Strain: Low Risk (12/06/2023)  Physical Activity: Insufficiently Active (04/11/2024)  Social Connections: Moderately Integrated (04/11/2024)  Stress: No Stress Concern Present (04/11/2024)  Tobacco Use: Low Risk (04/11/2024)  Health Literacy: Adequate Health Literacy (04/11/2024)   See flowsheets for full screening details  Depression Screen PHQ 2 & 9 Depression Scale- Over the past 2 weeks, how often have you been bothered by any of the following problems? Little interest or pleasure in doing things: 0 Feeling down, depressed, or hopeless (PHQ Adolescent also includes...irritable): 0 PHQ-2 Total Score: 0 Trouble falling or staying asleep, or sleeping too much: 0 Feeling tired or having little energy: 0 Poor appetite or overeating (PHQ Adolescent also includes...weight loss): 0 Feeling bad about yourself - or that you are a failure or have let yourself or your family down: 0 Trouble concentrating on things, such as reading the newspaper or watching television (PHQ Adolescent also includes...like school work): 0  Moving or speaking so slowly that other people could have noticed. Or the opposite - being so fidgety or restless that you have been moving around a lot more than usual: 0 Thoughts that you would be better off dead, or of hurting yourself in some way: 0 PHQ-9 Total Score: 0 If you checked off any problems, how difficult have these problems made it for you to do your work, take care of things at  home, or get along with other people?: Not difficult at all     Goals Addressed             This Visit's Progress    COMPLETED: Patient Stated       09/30/2021, no goals     Patient Stated   On track    04/04/23- Stay healthy, stay off medications, maybe lose a couple pounds             Objective:    Today's Vitals   04/11/24 0854  Weight: 152 lb (68.9 kg)  Height: 5' 5 (1.651 m)   Body mass index is 25.29 kg/m.  Hearing/Vision screen No results found. Immunizations and Health Maintenance Health Maintenance  Topic Date Due   Pneumococcal Vaccine: 50+ Years (1 of 1 - PCV) Never done   DTaP/Tdap/Td (2 - Td or Tdap) 03/06/2019   Influenza Vaccine  10/28/2023   Mammogram  09/05/2024   Medicare Annual Wellness (AWV)  04/11/2025   Colonoscopy  06/19/2025   Bone Density Scan  Completed   Hepatitis C Screening  Addressed   Meningococcal B Vaccine  Aged Out   Hepatitis B Vaccines 19-59 Average Risk  Discontinued   COVID-19 Vaccine  Discontinued   Zoster Vaccines- Shingrix  Discontinued        Assessment/Plan:  This is a routine wellness examination for Shelby Griffith.  Patient Care Team: Wendee Lynwood HERO, NP as PCP - Diedre Sheldon Standing, MD as Consulting Physician (General Surgery) Unk Corinn Skiff, MD as Consulting Physician (Gastroenterology) Laurice Francis NOVAK, OD [facility])  I have personally reviewed and noted the following in the patients chart:   Medical and social history Use of alcohol, tobacco or illicit drugs  Current medications and supplements including opioid prescriptions. Functional ability and status Nutritional status Physical activity Advanced directives List of other physicians Hospitalizations, surgeries, and ER visits in previous 12 months Vitals Screenings to include cognitive, depression, and falls Referrals and appointments  No orders of the defined types were placed in this encounter.  In addition, I have reviewed and discussed  with patient certain preventive protocols, quality metrics, and best practice recommendations. A written personalized care plan for preventive services as well as general preventive health recommendations were provided to patient.   Shelby LITTIE Saris, LPN   8/[id number]/7973   Return in 1 year (on 04/11/2025).Align AWV w/CPE visit in May 2027  After Visit Summary: (MyChart) Due to this being a telephonic visit, the after visit summary with patients personalized plan was offered to patient via MyChart   Nurse Notes: No voiced or noted concerns at this time Patient advised to keep follow-up appointment with PCP (May 2026) Vaccines not given: Will obtain PCV at next visit Influenza received at [facility] documented "

## 2024-04-11 NOTE — Patient Instructions (Signed)
 Shelby Griffith,  Thank you for taking the time for your Medicare Wellness Visit. I appreciate your continued commitment to your health goals. Please review the care plan we discussed, and feel free to reach out if I can assist you further.  Please note that Annual Wellness Visits do not include a physical exam. Some assessments may be limited, especially if the visit was conducted virtually. If needed, we may recommend an in-person follow-up with your provider.  Ongoing Care Seeing your primary care provider every 3 to 6 months helps us  monitor your health and provide consistent, personalized care.   Referrals If a referral was made during today's visit and you haven't received any updates within two weeks, please contact the referred provider directly to check on the status.  Recommended Screenings:  Health Maintenance  Topic Date Due   Pneumococcal Vaccine for age over 66 (1 of 1 - PCV) Never done   DTaP/Tdap/Td vaccine (2 - Td or Tdap) 03/06/2019   Flu Shot  10/28/2023   Medicare Annual Wellness Visit  04/03/2024   Breast Cancer Screening  09/05/2024   Colon Cancer Screening  06/19/2025   Osteoporosis screening with Bone Density Scan  Completed   Hepatitis C Screening  Addressed   Meningitis B Vaccine  Aged Out   Hepatitis B Vaccine  Discontinued   COVID-19 Vaccine  Discontinued   Zoster (Shingles) Vaccine  Discontinued       04/11/2024    8:55 AM  Advanced Directives  Does Patient Have a Medical Advance Directive? Yes  Type of Estate Agent of [place];Living will  Does patient want to make changes to medical advance directive? No - Patient declined  Copy of Healthcare Power of Attorney in Chart? No - copy requested    Vision: Annual vision screenings are recommended for early detection of glaucoma, cataracts, and diabetic retinopathy. These exams can also reveal signs of chronic conditions such as diabetes and high blood pressure.  Dental: Annual dental  screenings help detect early signs of oral cancer, gum disease, and other conditions linked to overall health, including heart disease and diabetes.  Please see the attached documents for additional preventive care recommendations.

## 2024-08-09 ENCOUNTER — Encounter: Admitting: Nurse Practitioner
